# Patient Record
Sex: Female | Born: 1935 | Race: White | Hispanic: No | Smoking: Never smoker
Health system: Southern US, Community
[De-identification: ages and names within clinical notes are randomized; demographics above are authoritative.]

## PROBLEM LIST (undated history)

## (undated) DIAGNOSIS — N200 Calculus of kidney: Secondary | ICD-10-CM

## (undated) DIAGNOSIS — M8580 Other specified disorders of bone density and structure, unspecified site: Secondary | ICD-10-CM

## (undated) DIAGNOSIS — I739 Peripheral vascular disease, unspecified: Secondary | ICD-10-CM

## (undated) DIAGNOSIS — D6851 Activated protein C resistance: Secondary | ICD-10-CM

## (undated) DIAGNOSIS — I639 Cerebral infarction, unspecified: Secondary | ICD-10-CM

## (undated) DIAGNOSIS — M51369 Other intervertebral disc degeneration, lumbar region without mention of lumbar back pain or lower extremity pain: Secondary | ICD-10-CM

## (undated) DIAGNOSIS — M5136 Other intervertebral disc degeneration, lumbar region: Secondary | ICD-10-CM

## (undated) DIAGNOSIS — K648 Other hemorrhoids: Secondary | ICD-10-CM

## (undated) DIAGNOSIS — K219 Gastro-esophageal reflux disease without esophagitis: Secondary | ICD-10-CM

## (undated) DIAGNOSIS — E785 Hyperlipidemia, unspecified: Secondary | ICD-10-CM

## (undated) DIAGNOSIS — I10 Essential (primary) hypertension: Secondary | ICD-10-CM

## (undated) DIAGNOSIS — E042 Nontoxic multinodular goiter: Secondary | ICD-10-CM

## (undated) DIAGNOSIS — I2510 Atherosclerotic heart disease of native coronary artery without angina pectoris: Secondary | ICD-10-CM

## (undated) DIAGNOSIS — D689 Coagulation defect, unspecified: Secondary | ICD-10-CM

## (undated) DIAGNOSIS — Z5189 Encounter for other specified aftercare: Secondary | ICD-10-CM

## (undated) DIAGNOSIS — I8390 Asymptomatic varicose veins of unspecified lower extremity: Secondary | ICD-10-CM

## (undated) DIAGNOSIS — I809 Phlebitis and thrombophlebitis of unspecified site: Secondary | ICD-10-CM

## (undated) HISTORY — DX: Other hemorrhoids: K64.8

## (undated) HISTORY — DX: Activated protein C resistance: D68.51

## (undated) HISTORY — DX: Nontoxic multinodular goiter: E04.2

## (undated) HISTORY — DX: Peripheral vascular disease, unspecified: I73.9

## (undated) HISTORY — DX: Atherosclerotic heart disease of native coronary artery without angina pectoris: I25.10

## (undated) HISTORY — PX: TOTAL ABDOMINAL HYSTERECTOMY: SHX209

## (undated) HISTORY — DX: Other intervertebral disc degeneration, lumbar region: M51.36

## (undated) HISTORY — DX: Other specified disorders of bone density and structure, unspecified site: M85.80

## (undated) HISTORY — PX: CORONARY ARTERY BYPASS GRAFT: SHX141

## (undated) HISTORY — DX: Other intervertebral disc degeneration, lumbar region without mention of lumbar back pain or lower extremity pain: M51.369

## (undated) HISTORY — DX: Phlebitis and thrombophlebitis of unspecified site: I80.9

## (undated) HISTORY — DX: Coagulation defect, unspecified: D68.9

## (undated) HISTORY — DX: Gastro-esophageal reflux disease without esophagitis: K21.9

## (undated) HISTORY — DX: Hyperlipidemia, unspecified: E78.5

## (undated) HISTORY — PX: BLADDER REPAIR: SHX76

## (undated) HISTORY — DX: Asymptomatic varicose veins of unspecified lower extremity: I83.90

## (undated) HISTORY — DX: Encounter for other specified aftercare: Z51.89

## (undated) HISTORY — DX: Calculus of kidney: N20.0

## (undated) HISTORY — PX: BREAST BIOPSY: SHX20

## (undated) HISTORY — DX: Essential (primary) hypertension: I10

---

## 1999-03-02 ENCOUNTER — Other Ambulatory Visit: Admission: RE | Admit: 1999-03-02 | Discharge: 1999-03-02 | Payer: Self-pay | Admitting: Obstetrics and Gynecology

## 2000-03-01 ENCOUNTER — Other Ambulatory Visit: Admission: RE | Admit: 2000-03-01 | Discharge: 2000-03-01 | Payer: Self-pay | Admitting: Obstetrics and Gynecology

## 2001-04-10 ENCOUNTER — Other Ambulatory Visit: Admission: RE | Admit: 2001-04-10 | Discharge: 2001-04-10 | Payer: Self-pay | Admitting: Obstetrics and Gynecology

## 2002-07-22 ENCOUNTER — Ambulatory Visit (HOSPITAL_COMMUNITY): Admission: RE | Admit: 2002-07-22 | Discharge: 2002-07-22 | Payer: Self-pay | Admitting: Orthopedic Surgery

## 2002-08-14 ENCOUNTER — Encounter: Admission: RE | Admit: 2002-08-14 | Discharge: 2002-08-14 | Payer: Self-pay | Admitting: Internal Medicine

## 2002-08-14 ENCOUNTER — Encounter: Payer: Self-pay | Admitting: Internal Medicine

## 2002-08-15 ENCOUNTER — Encounter: Payer: Self-pay | Admitting: *Deleted

## 2002-08-15 ENCOUNTER — Encounter: Payer: Self-pay | Admitting: Emergency Medicine

## 2002-08-15 ENCOUNTER — Emergency Department (HOSPITAL_COMMUNITY): Admission: EM | Admit: 2002-08-15 | Discharge: 2002-08-15 | Payer: Self-pay | Admitting: Emergency Medicine

## 2002-11-13 ENCOUNTER — Encounter: Admission: RE | Admit: 2002-11-13 | Discharge: 2003-02-11 | Payer: Self-pay | Admitting: Internal Medicine

## 2002-12-10 HISTORY — PX: CORONARY ARTERY BYPASS GRAFT: SHX141

## 2003-07-06 ENCOUNTER — Inpatient Hospital Stay (HOSPITAL_COMMUNITY): Admission: RE | Admit: 2003-07-06 | Discharge: 2003-07-13 | Payer: Self-pay | Admitting: Cardiology

## 2003-07-07 ENCOUNTER — Encounter: Payer: Self-pay | Admitting: Surgery

## 2003-07-08 ENCOUNTER — Encounter: Payer: Self-pay | Admitting: Surgery

## 2003-07-09 ENCOUNTER — Encounter: Payer: Self-pay | Admitting: Surgery

## 2003-07-10 ENCOUNTER — Encounter: Payer: Self-pay | Admitting: Surgery

## 2003-08-09 ENCOUNTER — Encounter (HOSPITAL_COMMUNITY): Admission: RE | Admit: 2003-08-09 | Discharge: 2003-11-07 | Payer: Self-pay | Admitting: Cardiology

## 2004-07-18 ENCOUNTER — Emergency Department (HOSPITAL_COMMUNITY): Admission: EM | Admit: 2004-07-18 | Discharge: 2004-07-19 | Payer: Self-pay | Admitting: Emergency Medicine

## 2004-07-27 ENCOUNTER — Encounter: Admission: RE | Admit: 2004-07-27 | Discharge: 2004-07-27 | Payer: Self-pay | Admitting: Internal Medicine

## 2004-07-31 ENCOUNTER — Encounter: Admission: RE | Admit: 2004-07-31 | Discharge: 2004-08-22 | Payer: Self-pay | Admitting: Internal Medicine

## 2004-10-05 ENCOUNTER — Encounter: Admission: RE | Admit: 2004-10-05 | Discharge: 2004-10-05 | Payer: Self-pay | Admitting: Orthopedic Surgery

## 2006-03-11 ENCOUNTER — Encounter: Admission: RE | Admit: 2006-03-11 | Discharge: 2006-03-11 | Payer: Self-pay | Admitting: Orthopedic Surgery

## 2006-03-12 ENCOUNTER — Encounter (INDEPENDENT_AMBULATORY_CARE_PROVIDER_SITE_OTHER): Payer: Self-pay | Admitting: *Deleted

## 2006-03-12 ENCOUNTER — Ambulatory Visit (HOSPITAL_BASED_OUTPATIENT_CLINIC_OR_DEPARTMENT_OTHER): Admission: RE | Admit: 2006-03-12 | Discharge: 2006-03-12 | Payer: Self-pay | Admitting: Orthopedic Surgery

## 2006-10-19 ENCOUNTER — Emergency Department (HOSPITAL_COMMUNITY): Admission: EM | Admit: 2006-10-19 | Discharge: 2006-10-19 | Payer: Self-pay | Admitting: Family Medicine

## 2007-01-07 ENCOUNTER — Encounter: Admission: RE | Admit: 2007-01-07 | Discharge: 2007-01-07 | Payer: Self-pay | Admitting: Internal Medicine

## 2007-01-13 ENCOUNTER — Encounter: Admission: RE | Admit: 2007-01-13 | Discharge: 2007-01-13 | Payer: Self-pay | Admitting: Internal Medicine

## 2007-01-27 ENCOUNTER — Other Ambulatory Visit: Admission: RE | Admit: 2007-01-27 | Discharge: 2007-01-27 | Payer: Self-pay | Admitting: Interventional Radiology

## 2007-01-27 ENCOUNTER — Encounter (INDEPENDENT_AMBULATORY_CARE_PROVIDER_SITE_OTHER): Payer: Self-pay | Admitting: *Deleted

## 2007-01-27 ENCOUNTER — Encounter: Admission: RE | Admit: 2007-01-27 | Discharge: 2007-01-27 | Payer: Self-pay | Admitting: Internal Medicine

## 2007-02-20 ENCOUNTER — Encounter: Admission: RE | Admit: 2007-02-20 | Discharge: 2007-02-20 | Payer: Self-pay | Admitting: Internal Medicine

## 2008-03-03 ENCOUNTER — Encounter: Admission: RE | Admit: 2008-03-03 | Discharge: 2008-03-03 | Payer: Self-pay | Admitting: Orthopedic Surgery

## 2008-03-30 ENCOUNTER — Encounter: Admission: RE | Admit: 2008-03-30 | Discharge: 2008-03-30 | Payer: Self-pay | Admitting: Orthopedic Surgery

## 2009-06-30 ENCOUNTER — Emergency Department (HOSPITAL_COMMUNITY): Admission: EM | Admit: 2009-06-30 | Discharge: 2009-06-30 | Payer: Self-pay | Admitting: Emergency Medicine

## 2009-07-28 ENCOUNTER — Encounter: Admission: RE | Admit: 2009-07-28 | Discharge: 2009-07-28 | Payer: Self-pay | Admitting: Internal Medicine

## 2010-01-31 ENCOUNTER — Encounter: Admission: RE | Admit: 2010-01-31 | Discharge: 2010-01-31 | Payer: Self-pay | Admitting: Internal Medicine

## 2010-02-20 ENCOUNTER — Encounter: Admission: RE | Admit: 2010-02-20 | Discharge: 2010-03-28 | Payer: Self-pay | Admitting: Internal Medicine

## 2010-11-21 ENCOUNTER — Ambulatory Visit: Payer: Self-pay | Admitting: Cardiology

## 2011-02-07 ENCOUNTER — Encounter (HOSPITAL_BASED_OUTPATIENT_CLINIC_OR_DEPARTMENT_OTHER)
Admission: RE | Admit: 2011-02-07 | Discharge: 2011-02-07 | Disposition: A | Discharge status: Home / Self Care | Payer: Medicare Other | Source: Ambulatory Visit | Attending: Orthopedic Surgery | Admitting: Orthopedic Surgery

## 2011-02-07 DIAGNOSIS — Z01812 Encounter for preprocedural laboratory examination: Secondary | ICD-10-CM | POA: Insufficient documentation

## 2011-02-07 DIAGNOSIS — Z01810 Encounter for preprocedural cardiovascular examination: Secondary | ICD-10-CM | POA: Insufficient documentation

## 2011-02-07 LAB — BASIC METABOLIC PANEL
BUN: 23 mg/dL (ref 6–23)
CO2: 28 mEq/L (ref 19–32)
Calcium: 10.1 mg/dL (ref 8.4–10.5)
Chloride: 107 mEq/L (ref 96–112)
Creatinine, Ser: 1.22 mg/dL — ABNORMAL HIGH (ref 0.4–1.2)
GFR calc Af Amer: 51 mL/min — ABNORMAL LOW (ref >60)
GFR calc non Af Amer: 42 mL/min — ABNORMAL LOW (ref >60)
Glucose, Bld: 133 mg/dL — ABNORMAL HIGH (ref 70–99)
Potassium: 4.2 mEq/L (ref 3.5–5.1)
Sodium: 141 mEq/L (ref 135–145)

## 2011-02-07 LAB — PROTIME-INR
INR: 1.05 (ref 0.00–1.49)
Prothrombin Time: 13.9 seconds (ref 11.6–15.2)

## 2011-02-07 LAB — APTT: aPTT: 26 seconds (ref 24–37)

## 2011-02-08 ENCOUNTER — Ambulatory Visit (HOSPITAL_COMMUNITY): Payer: Medicare Other

## 2011-02-08 ENCOUNTER — Ambulatory Visit (HOSPITAL_BASED_OUTPATIENT_CLINIC_OR_DEPARTMENT_OTHER)
Admission: RE | Admit: 2011-02-08 | Discharge: 2011-02-08 | Disposition: A | Discharge status: Home / Self Care | Payer: Medicare Other | Source: Ambulatory Visit | Attending: Orthopedic Surgery | Admitting: Orthopedic Surgery

## 2011-02-08 DIAGNOSIS — Z01812 Encounter for preprocedural laboratory examination: Secondary | ICD-10-CM | POA: Insufficient documentation

## 2011-02-08 DIAGNOSIS — G5600 Carpal tunnel syndrome, unspecified upper limb: Secondary | ICD-10-CM | POA: Insufficient documentation

## 2011-02-08 DIAGNOSIS — Z01810 Encounter for preprocedural cardiovascular examination: Secondary | ICD-10-CM | POA: Insufficient documentation

## 2011-02-08 DIAGNOSIS — Z01818 Encounter for other preprocedural examination: Secondary | ICD-10-CM | POA: Insufficient documentation

## 2011-02-08 LAB — POCT HEMOGLOBIN-HEMACUE: Hemoglobin: 12.9 g/dL (ref 12.0–15.0)

## 2011-02-20 NOTE — Op Note (Signed)
NAMEARYKA, COONRADT                 ACCOUNT NO.:  [id number]  MEDICAL RECORD NO.:  [id number]         PATIENT TYPE:  AMB  LOCATION:                               FACILITY:  [facility]  PHYSICIAN:  Katy Fitch. Nakeya Adinolfi, M.D. DATE OF BIRTH:  1923/09/11  DATE OF PROCEDURE:  02/08/2011 DATE OF DISCHARGE:                              OPERATIVE REPORT   PREOPERATIVE DIAGNOSIS:  Entrapment neuropathy median nerve, right carpal tunnel.  POSTOPERATIVE DIAGNOSIS:  Entrapment neuropathy median nerve, right carpal tunnel.  OPERATIONS:  Release of right transverse carpal ligament.  OPERATING SURGEON:  Katy Fitch. Adilynn Bessey, MD  ASSISTANT:  Marveen Reeks Dasnoit, PA  ANESTHESIA:  General by LMA.  SUPERVISING ANESTHESIOLOGIST:  Janetta Hora. Gelene Mink, MD  INDICATIONS:  Heather Alvarado is a very pleasant spry 77 year old woman referred through the courtesy of Dr. Creola Corn of [facility] for evaluation and management of bilateral hand numbness. She has had prior trigger fingers in the past.  Clinical examination at our office revealed signs of clinical carpal tunnel syndrome. Electrodiagnostic studies were completed by Dr. Johna Roles revealed significant bilateral carpal tunnel syndrome, right greater than left.  Ms. Meara is on chronic Coumadin anticoagulation.  We spoke with her primary care physician and advised her to hold her Coumadin for 5-7 days prior to surgery.  Her preoperative INR was 1.05.  She is brought to the operating room at this time anticipating release of right transverse carpal ligament.  PROCEDURE:  Jessic S. Mcgaugh was brought to room one of the [facility] and placed in supine position on the operating table.  Following the induction of general anesthesia by LMA technique, the right arm was prepped with Betadine soap and solution and sterilely draped.  Following routine surgical time-out, the procedure commenced with exsanguination of right arm with Esmarch  bandage, inflation of arterial tourniquet and proximal brachium at 220 mmHg.  A short incision was fashioned in line of the ring finger and the palm.  Subcutaneous tissues were carefully divided revealing the palmar fascia.  This was split longitudinally to reveal the common sensory branch of the median nerve.  These were followed back to the transverse carpal ligament which was gently isolated from the median nerve.  The ligament was then released along its ulnar border extending into the distal forearm.  This widely opened the carpal canal.  The contents of the canal revealed a fibrotic bursa.  No mass or other predicaments were noted.  The wound was then inspected for bleeding points which were not problematic.  The wound was repaired with intradermal 3-0 Prolene and compressive splint.  For aftercare, [name] is provided prescriptions for Vicodin 5 mg one p.o. q.4-6 h. p.r.n. pain.  She will resume her routine Coumadin dose this afternoon.  I will see her back for follow up in a week for dressing change.  She will contact us immediately should she have difficulties with bleeding, excessive bruising, or possible wound complications.     Katy Fitch Saraann Enneking, M.D.     RVS/MEDQ  D:  02/08/2011  T:  02/08/2011  Job:  [id number]  cc:  Gwen Pounds, MD  Electronically Signed by Josephine Igo M.D. on 02/20/2011 01:09:28 PM

## 2011-03-18 LAB — CBC
HCT: 41.2 % (ref 36.0–46.0)
Hemoglobin: 13.9 g/dL (ref 12.0–15.0)
MCHC: 33.7 g/dL (ref 30.0–36.0)
MCV: 89.3 fL (ref 78.0–100.0)
Platelets: 230 10*3/uL (ref 150–400)
RBC: 4.61 MIL/uL (ref 3.87–5.11)
RDW: 13.6 % (ref 11.5–15.5)
WBC: 6.8 10*3/uL (ref 4.0–10.5)

## 2011-03-18 LAB — DIFFERENTIAL
Basophils Absolute: 0 10*3/uL (ref 0.0–0.1)
Basophils Relative: 1 % (ref 0–1)
Eosinophils Absolute: 0.1 10*3/uL (ref 0.0–0.7)
Eosinophils Relative: 1 % (ref 0–5)
Lymphocytes Relative: 30 % (ref 12–46)
Lymphs Abs: 2 10*3/uL (ref 0.7–4.0)
Monocytes Absolute: 0.6 10*3/uL (ref 0.1–1.0)
Monocytes Relative: 9 % (ref 3–12)
Neutro Abs: 4 10*3/uL (ref 1.7–7.7)
Neutrophils Relative %: 59 % (ref 43–77)

## 2011-03-18 LAB — PROTIME-INR: Prothrombin Time: 24.7 seconds — ABNORMAL HIGH (ref 11.6–15.2)

## 2011-04-27 NOTE — Cardiovascular Report (Signed)
NAMECAELEN, HIGINBOTHAM                       ACCOUNT NO.:  [id number]   MEDICAL RECORD NO.:  [id number]                   PATIENT TYPE:  INP   LOCATION:  2038                                 FACILITY:  [facility]   PHYSICIAN:  Evelene Croon, M.D.                  DATE OF BIRTH:  May 23, 1923   DATE OF PROCEDURE:  DATE OF DISCHARGE:  07/13/2003                              CARDIAC CATHETERIZATION   HISTORY OF PRESENT ILLNESS:  This patient is a 77 year old white female who  presented with a 2-week history of progressive chest pain in the upper  parasternal region, associated with shortness of breath and marked fatigue.  Her symptoms have been relieved with rest. The patient has a previous  history of recurrent DVT, requiring Coumadin therapy and therefore, a chest  CT scan was done to rule out  pulmonary embolism and this was negative. It  did suggest a substernal goiter. She underwent a Cardiolite scan, which  showed inferolateral ischemia with an ejection fraction of 52%. She was  admitted this hospitalization for further evaluation and treatment including  cardiac catheterization.   PAST MEDICAL HISTORY:  1. Factor V Leiden deficiency with recurrent DVT, requiring chronic Coumadin     therapy.  2. History of hyperlipidemia.  3. History of hypertension.   PAST SURGICAL HISTORY:  1. Status post right leg varicose vein ligation.  2. She has undergone surgery for a trigger finger.  3. Status post hysterectomy.  4. Status post bladder repair.  5. Status post right breast biopsy.   ADMISSION MEDICATIONS:  1. Hydrochlorothiazide 25 mg daily.  2. Coumadin 5 mg daily.  3. Aciphex 20 mg daily.  4. Benazepril 10 mg daily.  5. Os-Cal 1,000 mg daily.  6. Multivitamin 1 daily.   FAMILY HISTORY/SOCIAL HISTORY/REVIEW OF SYSTEMS/PHYSICAL EXAMINATION:  Please see the history and physical done at the time of admission.   ALLERGIES:  She does have an allergy to CODEINE, IVP DYE.   HOSPITAL  COURSE:  The patient was admitted. She was pre-medicated for  cardiac catheterization. She was found to have multi-vessel coronary artery  disease including the following lesions:  LAD had a 50-60% stenosis after  the first diagonal branch. There was a 90% mid vessel stenosis. The diagonal  branch itself had about 90% disease. There was an intermediate vessel that  had a 50% osteal stenosis. The left circumflex had about 60% stenosis, after  the small first marginal branch. The right coronary artery had a 95%  stenosis at the crux and was a large dominant system. Ejection fraction was  80%. Due to these findings, surgical consultation was obtained with Evelene Croon, M.D. and he evaluated the patient and recommended surgical re-  vascularization. Preoperative vein mapping showed poor veins and  subsequently, it was felt that she would require all arterial bypass.   PROCEDURE:  On July 08, 2003 the patient was taken to  the cardiac operating  room where she underwent the following procedure:  Coronary artery bypass  grafting times 4. The following grafts were placed.  1.  Left internal  mammary artery to the LAD.  2.  Right internal mammary artery to the right  coronary artery.  3.  Left radial artery sequential graft to the obtuse  marginal 1 and obtuse marginal 2. Cross clamp time was 87 minutes. The  patient tolerated the procedure well. It was noted intraoperatively that the  diagonal vessel was not felt to be a graftable artery.   POSTOPERATIVE HOSPITAL COURSE:  The patient did well postoperatively. She  had no significant difficulties in her intensive care stay. She was anemic  to a hematocrit of 24.1, requiring 1 unit of packed red blood cell  transfusion. The patient did improve to a level of 28 on postoperative day  2.  The patient was restarted back on her Coumadin. All routine lines,  monitoring devices, and drainage devices were discontinued in a step-wise  manner without  difficulty. She tolerated a rapid advancement in regard to  cardiac rehabilitation phase I modalities. Her incisions are healing well  with her left arm neurovascularly intact, although there is evidence of  significant ecchymosis, which is stable and improving daily. Chest x-ray  reveals a small right effusion and she is felt to require further diuresis  at home. Rhythm has been maintained in normal sinus. Coumadin has been  adjusted daily due to her factor V deficiency and previous history of DVT.  Overall, it is felt that she is tentatively stable for discharge on the  morning of July 13, 2003, pending morning round re-evaluation.   DISCHARGE MEDICATIONS:  1. Lasix 40 mg daily for an additional 14 days.  2. K-Dur 20 mEq daily for an additional 14 days.  3. Coumadin 5 mg daily and as directed by Dr. Timothy Lasso. First INR should be     drawn at his office on July 15, 2003 and adjusted accordingly.  4. Toprol XL 25 mg daily.  5. Imdur 30 mg daily for 1 month for the radial artery graft.  6. Zocor 40 mg daily.   INSTRUCTIONS:  The patient received written instructions in regard to  medications, activity, diet, wound care and followup.   FOLLOW UP:  Will include Dr. Swaziland in 2 weeks. Dr. Laneta Simmers August 03, 2003  at 11:45 a.m.   FINAL DIAGNOSES:  1. Severe multivessel coronary artery disease as described above. Normal     ejection fraction, 80% on cardiac catheterization.  2. Factor V Leiden deficiency.  3. History of hyperlipidemia.  4. History of hypertension.  5. Postoperative anemia, improved.     Rowe Clack, P.A.-C.                    Evelene Croon, M.D.    Sherryll Burger  D:  07/12/2003  T:  07/13/2003  Job:  [id number]   cc:   Evelene Croon, M.D.  [address]  [place]  [place] [address]  Fax: [phone]   Peter M. Swaziland, M.D.  [address]  [place], [place] [address]  Fax: [phone]   Gwen Pounds, M.D.  [address] [place]  [place] [address]  Fax:  [phone]

## 2011-04-27 NOTE — H&P (Signed)
NAME:  Heather Alvarado, Heather Alvarado NO.:  [id number]   MEDICAL RECORD NO.:  [id number]                   PATIENT TYPE:  OIB   LOCATION:                                       FACILITY:  [facility]   PHYSICIAN:  Peter M. Swaziland, M.D.               DATE OF BIRTH:  1923/08/04   DATE OF ADMISSION:  07/06/2003  DATE OF DISCHARGE:                                HISTORY & PHYSICAL   HISTORY OF PRESENT ILLNESS:  Heather Alvarado is a 77 year old white female who  presents with a two-week history of typical anginal symptoms.  She describes  it as upper parasternal pain associated with shortness of breath on exertion  and marked fatigue.  It is relieved with rest.  She had undergone a previous  chest CT scan with contrast which was negative for pulmonary embolus or  aortic dissection.  She had a stress Cardiolite study which was subtle, but  suggestive for inferolateral ischemia.  The ejection fraction was 83% with  small ventricular size.  The patient is now admitted for cardiac  catheterization.   PAST MEDICAL HISTORY:  1. Significant for factor V Leiden deficiency with recurrent deep venous     thrombosis.  She has been on chronic Coumadin therapy.  2. She has a history of IVP DYE ALLERGY associated with recent rash with     contrast for CT scan.  3. She has a history of hyperlipidemia which is combined.  4. History of hypertension.   PAST SURGICAL HISTORY:  1. She has had previous vein surgery.  2. Previous surgery for trigger finger.  3. Previous hysterectomy.  4. She is status post bladder repair.  5. She has had previous right breast biopsy.   ALLERGIES:  1. CODEINE.  2. IVP DYE.   CURRENT MEDICATIONS:  1. Potassium 20 mEq daily.  2. HCTZ 25 mg per day.  3. Aciphex 20 mg per day.  4. ________ 10 mg per day.  5. Os-Cal 500 mg two tablets daily.  6. Multivitamins daily.  7. Coumadin 5 mg per day which is currently on hold.   FAMILY HISTORY:  Her father died at age  64 of old age.  Her mother died at  age 17 with myocardial infarction.  One brother died with myocardial  infarction.   SOCIAL HISTORY:  The patient is retired.  She is married.  She has two  children.  She denies tobacco or alcohol use.   REVIEW OF SYSTEMS:  Otherwise negative.   PHYSICAL EXAMINATION:  GENERAL APPEARANCE:  The patient is a pleasant white  female in no apparent distress.  VITAL SIGNS:  The blood pressure is 140/72, pulse 68 and regular, and  respirations are 18.  WEIGHT:  141 pounds.  HEENT:  Pupils equal, round, and reactive to light and accomodation.  Extraocular movements are full.  Sclerae are clear.  The oropharynx is  clear.  NECK:  Supple without JVD, adenopathy, thyromegaly, or bruits.  LUNGS:  Clear to auscultation and percussion.  CARDIAC:  Regular rate and rhythm without murmurs, rubs, gallops, or clicks.  ABDOMEN:  Soft and nontender without hepatosplenomegaly, masses, or bruits.  EXTREMITIES:  Femoral and pedal pulses are 2+ and symmetric.  There is no  evidence of phlebitis or edema.  NEUROLOGIC:  Nonfocal.   LABORATORY DATA:  The resting ECG showed minor nonspecific ST changes.  The  CMET was normal.  The CBC showed a white count of 12,100, platelets 330, and  hemoglobin 13.7.  The pro time on July 01, 2003, was 17.9 with an INR of  2.47.  A recent CT scan showed an intrathoracic goiter and otherwise normal.  The most recent lipid panel showed a cholesterol of 248, triglycerides 238,  LDL 146, and HDL 54.   IMPRESSION:  1. Angina pectoris with subtle inferolateral defect on Cardiolite study.  2. History of recurrent deep venous thrombosis.  3. Factor V Leiden deficiency.  4. Chronic Coumadin therapy.  5. Intravenous pyelogram dye allergy.  6. Hypertension.  7. Combined hyperlipidemia.   PLAN:  The patient will be admitted for cardiac catheterization.  Her  Coumadin is on hold and she will be bridged with subcu heparin.  She will be   pretreated for a dye allergy with prednisone and cimetidine.                                                Peter M. Swaziland, M.D.    PMJ/MEDQ  D:  07/05/2003  T:  07/05/2003  Job:  [id number]   cc:   Gwen Pounds, M.D.  [address]  [place]  [place] [address]  Fax: [phone]

## 2011-04-27 NOTE — Consult Note (Signed)
Heather Alvarado, Heather Alvarado                       ACCOUNT NO.:  [id number]   MEDICAL RECORD NO.:  [id number]                   PATIENT TYPE:  OIB   LOCATION:  2905                                 FACILITY:  [facility]   PHYSICIAN:  Evelene Croon, M.D.                  DATE OF BIRTH:  [birth year]   DATE OF CONSULTATION:  07/06/2003  DATE OF DISCHARGE:                                   CONSULTATION   REASON FOR CONSULTATION:  Severe three-vessel coronary artery disease with  unstable angina.   HISTORY OF PRESENT ILLNESS:  This patient is a 77 year old white female who  presented with a two-week history of progressive chest pain in the upper  parasternal region associated with shortness of breath and marked fatigue.  Her symptoms have been relieved with rest.  She has a history of previous  recurrent DVT requiring Coumadin therapy, and therefore a CT of the chest  was done to rule out pulmonary embolism, and this was negative; it did  suggest a substernal goiter.  She underwent a Cardiolite scan, which showed  inferolateral ischemia with an ejection fraction of 52%.  She underwent  cardiac catheterization today, which showed severe three-vessel disease.  The LAD had 50 to 60% stenosis at the first diagonal branch.  There was 90%  mid vessel stenosis.  The diagonal branch itself had about 90% disease.  There was an intermediate vessel that had 50% ostial stenosis.  The left  circumflex had about 60% stenosis after a small first marginal branch.  The  right coronary artery had 95% stenosis at the crux and was a large, dominant  system.  Ejection fraction was 80%.   PAST MEDICAL HISTORY:  Significant for:  1. Factor V Leiden deficiency with recurrent DVT requiring chronic Coumadin     therapy.  2. She has a history of hyperlipidemia.  3. She has a history of hypertension.   PAST SURGICAL HISTORY:  1. She is status post a right leg varicose vein ligation.  2. She has undergone surgery for a  trigger finger.  3. She is status post a hysterectomy.  4. She is status post bladder repair.  5. She is status post a right breast biopsy.   MEDICATIONS PRIOR TO ADMISSION:  1. HCTZ 25 mg daily.  2. Coumadin 5 mg daily.  3. Aciphex 20 mg daily.  4. Benazepril 10 mg daily.  5. Os-Cal 1000 mg daily.  6. Multivitamin daily.   REVIEW OF SYSTEMS:  GENERAL:  She denies fever or chills.  She has had no  weight loss.  She has had fatigue.  EYES:  Negative.  ENT:  Negative.  ENDOCRINE:  She denies diabetes and hypothyroidism.  CARDIOVASCULAR:  She  has had about two weeks of progressive parasternal left-sided chest pain.  She has shortness of breath.  She has had no PND or orthopnea.  She denies  peripheral edema and  palpitations.  RESPIRATORY:  She has had no cough or  sputum production.  GI:  She has had no nausea or vomiting.  She denies  melena and bright red blood per rectum.  GU:  She has had no dysuria or  hematuria.  VASCULAR:  She has had recurrent right leg DVT.  She has no  claudication.  PSYCHIATRIC:  Negative.  MUSCULOSKELETAL:  Negative.  NEUROLOGICAL:  She has never had a TIA or a stroke.  She denies any focal  weakness or numbness.  She has had no dizziness or syncope.   ALLERGIES:  1. CODEINE.  2. IVP DYE.   FAMILY HISTORY:  Her father died at age 19 of old age, and her mother died  at 67 with a myocardial infarction.  She has a brother who had a myocardial  infarction and is dead.   SOCIAL HISTORY:  She is retired and has two children.  She is a nonsmoker  and denies ethanol abuse.   PHYSICAL EXAMINATION:  VITAL SIGNS:  Her blood pressure is 110/45, and her  pulse is 70 and regular.  Oxygen saturation is 94% on room air.  She is  afebrile.  She is currently on a nitroglycerin drip at 3 mL/hour and a  heparin drip.  GENERAL:  She is a well-developed white female in no distress.  HEENT EXAM:  Shows her to be normocephalic and atraumatic.  The pupils are  equal and  reactive to light and accommodation.  Extraocular muscles are  intact.  Her throat is clear.  NECK EXAM:  Shows normal carotid pulses bilaterally.  There are no bruits.  There is no adenopathy or thyromegaly.  CARDIAC EXAM:  Has a regular rate and rhythm with a normal S1 and S2.  There  is no murmur, rub, or gallop.  LUNGS:  Clear.  ABDOMINAL EXAM:  Shows active bowel sounds.  Her abdomen is soft, mildly  obese, and nontender.  There are no palpable masses or organomegaly.  EXTREMITY EXAM:  Shows no peripheral edema.  Pedal pulses are palpable  bilaterally.  Her right lower leg has some brownish discoloration from  varicose vein ligations.  NEUROLOGIC EXAM:  Shows her to be alert and oriented times three.  Motor and  sensory exam is grossly normal.   LABORATORY DATA:  Carotid Doppler examination shows bilateral mild internal  carotid artery plaque without stenosis.  Her ABI is greater than 1  bilaterally.   IMPRESSION AND PLAN:  In summary, [name] has severe three-vessel coronary  artery disease, unstable anginal symptoms.  She had chest pain during her  catheterization, requiring intravenous nitroglycerin.  She is currently pain-  free.  I agree that coronary artery bypass graft surgery is the best  treatment for this patient.  I have discussed the operative procedure with  her and her son and daughter, including alternatives, benefits, and risks,  including bleeding, blood transfusion, infection, stroke, myocardial  infarction, and death; they understand and agree to proceed with surgery.  We will plan to do this on Thursday, July 08, 2003.                                               Evelene Croon, M.D.    BB/MEDQ  D:  07/06/2003  T:  07/07/2003  Job:  [id number]

## 2011-04-27 NOTE — Op Note (Signed)
NAMEEVELINA, LORE                ACCOUNT NO.:  [id number]   MEDICAL RECORD NO.:  [id number]          PATIENT TYPE:  AMB   LOCATION:  DSC                          FACILITY:  [facility]   PHYSICIAN:  Katy Fitch. Sypher, M.D. DATE OF BIRTH:  [birth year]   DATE OF PROCEDURE:  03/12/2006  DATE OF DISCHARGE:                                 OPERATIVE REPORT   PREOPERATIVE DIAGNOSIS:  Chronic stenosing tenosynovitis right long finger  and Dupuytren's contracture affecting pretendinous fibers to right long and  ring finger.   POSTOPERATIVE DIAGNOSIS:  Chronic stenosing tenosynovitis right long finger  and Dupuytren's contracture affecting pretendinous fibers to right long and  ring finger.   OPERATION:  1.  Resection of pretendinous fibers and palmar fibromatosis to long and      ring finger rays right palm.  2.  Release of right long finger A1 pulley with synovectomy of flexor      tendons.   OPERATIONS:  Katy Fitch. Sypher, M.D.   ASSISTANT:  None.   ANESTHESIA:  2% lidocaine metacarpal head level block of right long and ring  finger supplemented by IV sedation.   SUPERVISING ANESTHESIOLOGIST:  Guadalupe Maple, M.D.   INDICATIONS:  Inetha Ahmiyah Coil is an 78 year old woman referred for  evaluation and management of Dupuytren's contracture and a locking right  long finger.  She had prior treatment of a locking right ring finger with  success.  She was troubled by persistent Dupuytren's contracture and  triggering of the long finger unresponsive to nonoperative measures.  She is  brought to the operating room this time for release of right long finger A1  pulley and excision of her pathologic palmar fibromatosis leading to  contracture of her MP joints.  Preoperatively, she was noted to be and  Coumadin due to past history of coronary artery disease and a clotting  factor abnormality that had led to deep vein thrombosis risk.  She was  weaned for her Coumadin for five days and had a  preoperative PTT and  prothrombin time that demonstrated correction of her clotting capacity.  After informed consent and after review of her medical records in Dr.  Elvis Coil office as well as preoperative EKG and chest x-ray, she is brought  to the operating at this time anticipating right long finger A1 pulley  release and Dupuytren's contracture excision.   PROCEDURE:  Daysha Aston Lawhorn is brought to the operating room and placed in  supine position on the table.  Following light sedation, the right arm was  prepped with Betadine soap solution, sterilely draped.  A pneumatic  tourniquet was applied to the proximal right brachium.  Following  exsanguination of the right arm with an Esmarch bandage, the arterial  tourniquet was inflated to 240 mmHg due to mild systolic hypertension.  2%  lidocaine was infiltrated in the region of the common sensory branches to  the index, long and ring fingers.  After 5 minutes when anesthesia was  complete, the procedure commenced with an oblique incision paralleling the  distal palmar crease.  The fascia was exposed over  a distance of 3.5 cm  overlying the long and ring finger rays.  There was significant contracture  of the pretendinous fibers in the palmar fascia.  This was circumferentially  dissected and removed with the septum between the index and long, long and  ring, and ring and small fingers.  After removal of fascia, the flexor  sheaths were carefully inspected.  There was considerable fibrosis and  inflammation of the long finger A1 pulley.  The inflammatory tissues were  cleared with scalpel and scissors dissection followed by release of the  pulley along its radial border with scissors.  The tendons were delivered  and a cuff of inflammatory tenosynovium removed.  Likewise, the cuff of  inflammatory tenosynovium proximal to the A1 pulley of the ring finger was  removed.  Thereafter, bleeding points were cauterized with bipolar current   followed by repair the skin with mattress sutures of 5-0 nylon.  A  compressive dressing applied with Xeroflow, sterile gauze, a sterile Kerlix,  and an Ace wrap compression wrap.  Ms. Hilley tolerated the surgery and  anesthesia well.   For aftercare, she is provided a prescription for Darvocet N100 1 p.o. q.4-6  h. p.r.n. pain, 30 tablets without refill.  She is also encouraged to use  Tylenol if she is not taking Darvocet.  She will not use Advil or aspirin  given her Coumadin anticoagulation.      Katy Fitch Sypher, M.D.  Electronically Signed     RVS/MEDQ  D:  03/12/2006  T:  03/12/2006  Job:  [id number]

## 2011-06-27 ENCOUNTER — Encounter (HOSPITAL_BASED_OUTPATIENT_CLINIC_OR_DEPARTMENT_OTHER)
Admission: RE | Admit: 2011-06-27 | Discharge: 2011-06-27 | Disposition: A | Discharge status: Home / Self Care | Payer: Medicare Other | Source: Ambulatory Visit | Attending: Orthopedic Surgery | Admitting: Orthopedic Surgery

## 2011-06-27 LAB — BASIC METABOLIC PANEL
BUN: 25 mg/dL — ABNORMAL HIGH (ref 6–23)
CO2: 25 mEq/L (ref 19–32)
Chloride: 105 mEq/L (ref 96–112)
Creatinine, Ser: 1.1 mg/dL (ref 0.50–1.10)
GFR calc Af Amer: 57 mL/min — ABNORMAL LOW (ref >60)
Glucose, Bld: 126 mg/dL — ABNORMAL HIGH (ref 70–99)
Potassium: 4.4 mEq/L (ref 3.5–5.1)

## 2011-06-28 ENCOUNTER — Ambulatory Visit (HOSPITAL_BASED_OUTPATIENT_CLINIC_OR_DEPARTMENT_OTHER)
Admission: RE | Admit: 2011-06-28 | Discharge: 2011-06-28 | Disposition: A | Discharge status: Home / Self Care | Payer: Medicare Other | Source: Ambulatory Visit | Attending: Orthopedic Surgery | Admitting: Orthopedic Surgery

## 2011-06-28 DIAGNOSIS — D6859 Other primary thrombophilia: Secondary | ICD-10-CM | POA: Insufficient documentation

## 2011-06-28 DIAGNOSIS — Z951 Presence of aortocoronary bypass graft: Secondary | ICD-10-CM | POA: Insufficient documentation

## 2011-06-28 DIAGNOSIS — I10 Essential (primary) hypertension: Secondary | ICD-10-CM | POA: Insufficient documentation

## 2011-06-28 DIAGNOSIS — Z01818 Encounter for other preprocedural examination: Secondary | ICD-10-CM | POA: Insufficient documentation

## 2011-06-28 DIAGNOSIS — K219 Gastro-esophageal reflux disease without esophagitis: Secondary | ICD-10-CM | POA: Insufficient documentation

## 2011-06-28 DIAGNOSIS — I2510 Atherosclerotic heart disease of native coronary artery without angina pectoris: Secondary | ICD-10-CM | POA: Insufficient documentation

## 2011-06-28 DIAGNOSIS — Z7901 Long term (current) use of anticoagulants: Secondary | ICD-10-CM | POA: Insufficient documentation

## 2011-06-28 DIAGNOSIS — G5600 Carpal tunnel syndrome, unspecified upper limb: Secondary | ICD-10-CM | POA: Insufficient documentation

## 2011-06-28 DIAGNOSIS — Z01812 Encounter for preprocedural laboratory examination: Secondary | ICD-10-CM | POA: Insufficient documentation

## 2011-06-29 NOTE — Op Note (Signed)
Heather Alvarado, Heather Alvarado                ACCOUNT NO.:  [id number]  MEDICAL RECORD NO.:  [id number]  LOCATION:                                 FACILITY:  PHYSICIAN:  Katy Fitch. Richey Doolittle, M.D.      DATE OF BIRTH:  DATE OF PROCEDURE:  06/28/2011 DATE OF DISCHARGE:                              OPERATIVE REPORT   PREOPERATIVE DIAGNOSIS:  Chronic left carpal tunnel syndrome.  POSTOPERATIVE DIAGNOSIS:  Chronic left carpal tunnel syndrome.  OPERATION:  Release of left transverse carpal  ligament.  OPERATING SURGEON:  Katy Fitch. Decarla Siemen, MD.  ASSISTANT:  Marveen Reeks Dasnoit, PAC.  ANESTHESIA:  General by LMA.  SUPERVISING ANESTHESIOLOGIST:  Janetta Hora. Gelene Mink, MD.  INDICATIONS:  Elaynah Virginia is an 77 year old woman referred through the courtesy of Dr. Creola Corn for evaluation and management of hand numbness.  Ms. Lopes has a complex past medical history with a hematologic abnormality and factor V deficiency.  She has had a history of chronic Coumadin anticoagulation.  She has multiple drug allergies. She is status post right carpal tunnel release without complication, now presents for left carpal tunnel release.  Preoperatively, she had electrodiagnostic studies that document significant left carpal tunnel syndrome.  She has held her Coumadin for 5 days and resumed Coumadin anticoagulation this afternoon.  She was interviewed in the holding area, questions were invited and answered in detail.  PROCEDURE IN DETAIL:  Salimah Martinovich was brought to room 1 of the [facility] and placed in supine position on the operating table. Under Dr. Thornton Dales direct supervision, general anesthesia by LMA technique was induced.  The left arm was prepped with Betadine soap and solution and sterilely draped.  A pneumatic tourniquet was applied at the proximal left brachium.  Following routine surgical time-out, left arm was exsanguinated with Esmarch bandage and arterial tourniquet inflated to  250 mmHg due to mild systolic hypertension.  Procedure commenced with a short incision in line of the ring finger in the palm. Subcutaneous tissues were carefully divided revealing the palmar fascia. This was split longitudinally to reveal the common sensory branch of the median nerve and the superficial palmar arch.  The carpal canal was sounded with a Penfield 4 elevator followed by sequential release of the transverse carpal ligament from distal to proximal with scissors along its ulnar border.  This widely opened the carpal canal.  No mass or other predicaments were noted.  Bleeding points were identified and meticulously electrocauterized with bipolar forceps.  The canal was inspected with the aid of a Sewall retractor.  No mass or predicaments were noted.  Bleeding points along the margin of the released ligament were not problematic.  Ms. Amero's wound was then repaired with intradermal 3-0 Prolene suture. A compressive dressing was applied with volar plaster splint maintaining the wrist in 10 degrees of dorsiflexion.  For aftercare, Ms. Suell is provided a prescription for Vicodin 5 mg 1 p.o. q.4-6 hours p.r.n. pain, 20 tablets without a refill.  We will see her back in followup in 7-8 days for dressing change, suture removal, and advancement to a postoperative excise program.  She is advised to resume her Coumadin anticoagulation today  with a standard dosing.  She will check with Dr. Timothy Lasso next week to have her INR evaluated to be certain her coagulation status has returned to a proper dosing schedule with Coumadin.     Katy Fitch Marquesa Rath, M.D.     RVS/MEDQ  D:  06/28/2011  T:  06/28/2011  Job:  [id number]  Electronically Signed by Josephine Igo M.D. on 06/29/2011 08:39:02 AM

## 2011-10-19 ENCOUNTER — Telehealth: Payer: Self-pay | Admitting: *Deleted

## 2011-10-19 NOTE — Telephone Encounter (Signed)
Received a telephone call from Dr. Timothy Lasso. He is seeing patient today. She is reporting rectal bleeding x 4 days. Dr. Timothy Lasso examined her and her stomach is soft, no blood on his glove with rectal exam and she is heme negative. She is on Coumadin which she is going to hold 5-7 days. She has seen Dr. Marina Goodell in the past. Last colonoscopy 01/12/2003- diverticulosis, colon polyps, internal hemorrhoids. He wants her seen next week by Dr. Princella Pellegrini is only here on 10/25/11 and has no appointments) or an extender(no appts for them either) As Doc of the Day, Please, advise how soon patient should be seen.

## 2011-10-19 NOTE — Telephone Encounter (Signed)
Add to fist available

## 2011-10-19 NOTE — Telephone Encounter (Signed)
Scheduled with Dr. Christella Hartigan at on 10/22/11 at 9:45/10:00 AM. Patient aware. Chart ordered for [facility]

## 2011-10-22 ENCOUNTER — Ambulatory Visit (INDEPENDENT_AMBULATORY_CARE_PROVIDER_SITE_OTHER): Payer: Medicare Other | Admitting: Gastroenterology

## 2011-10-22 ENCOUNTER — Encounter: Payer: Self-pay | Admitting: Gastroenterology

## 2011-10-22 DIAGNOSIS — K649 Unspecified hemorrhoids: Secondary | ICD-10-CM

## 2011-10-22 MED ORDER — PRAMOXINE-HC 1-1 % EX CREA: TOPICAL_CREAM | CUTANEOUS | Status: AC

## 2011-10-22 NOTE — Progress Notes (Signed)
HPI: This is a  very pleasant 77 year old woman. Her primary care physician asked Dr Marina Goodell to see her this week however he is out of town for the next few days and then his schedule is full.  She saw Dr. Marina Goodell in the office and then underwent  screening colonoscopy 2004 with Dr. Marina Goodell.  Diverticluosis, 2 subcentimeter polyps both removed.  Adenomas on path.  Was recommended for recall colonoscopy at 3 years (would have been 2007).  Has red blood with BM and and even with coughing on a single occasion.  She first noticed it several months ago, then also last week.  She saw red blood 4-5 times last week.  Never constipated.   She has been wearing a pad  Hb last week was 13.0 (mcv 92).    She has ''protruding hemorrhoids),  Has worn a pad recently.  She was told to stop her coumadin for 5-7 days.  (INR was 2.4 one week ago).   Review of systems: Pertinent positive and negative review of systems were noted in the above HPI section. Complete review of systems was performed and was otherwise normal.    Past Medical History  Diagnosis Date  . CAD (coronary artery disease)   . Factor V Leiden   . GERD (gastroesophageal reflux disease)   . Multinodular goiter   . Internal hemorrhoids   . Hyperlipidemia   . Hypertension   . Nephrolithiasis   . Osteopenia   . Phlebitis   . PVD (peripheral vascular disease)   . Varicose vein   . Vitamin D deficiency   . DDD (degenerative disc disease), lumbar   . Goiter     Past Surgical History  Procedure Date  . Total abdominal hysterectomy   . Bladder repair   . Coronary artery bypass graft     Current Outpatient Prescriptions  Medication Sig Dispense Refill  . Calcium Carbonate-Vitamin D (CALCIUM-D PO) Take 1 capsule by mouth 2 (two) times daily.        . Cholecalciferol (VITAMIN D) 2000 UNITS CAPS Take 1 capsule by mouth daily.        . cyclobenzaprine (FLEXERIL) 10 MG tablet Take 10 mg by mouth 2 (two) times daily as needed.        .  hydrochlorothiazide (HYDRODIURIL) 25 MG tablet Take 25 mg by mouth daily.        . Misc Natural Products (OSTEO BI-FLEX ADV DOUBLE ST) CAPS Take 2 capsules by mouth daily.        . Multiple Vitamins-Minerals (MULTIVITAMIN WITH MINERALS) tablet Take 1 tablet by mouth daily.        Marland Kitchen olmesartan (BENICAR) 20 MG tablet Take 20 mg by mouth daily.        . Omega-3 Fatty Acids (FISH OIL) 1000 MG CAPS Take 1 capsule by mouth daily.        . pantoprazole (PROTONIX) 40 MG tablet Take 40 mg by mouth daily.        . potassium chloride SA (K-DUR,KLOR-CON) 20 MEQ tablet Take 20 mEq by mouth daily.        . simvastatin (ZOCOR) 40 MG tablet Take 40 mg by mouth at bedtime.        . Teriparatide, Recombinant, (FORTEO) 600 MCG/2.4ML SOLN Inject into the skin. As directed       . warfarin (COUMADIN) 5 MG tablet Take 5 mg by mouth daily.          Allergies as of 10/22/2011 - Review  Complete 10/22/2011  Allergen Reaction Noted  . Ivp dye (iodinated diagnostic agents)  10/22/2011    Family History  Problem Relation Age of Onset  . Coronary artery disease Mother   . Alzheimer's disease Sister   . Heart disease Brother     History   Social History  . Marital Status: Married    Spouse Name: N/A    Number of Children: 2  . Years of Education: N/A   Occupational History  . Retired    Social History Main Topics  . Smoking status: Never Smoker   . Smokeless tobacco: Never Used  . Alcohol Use: Yes     occ  . Drug Use: No  . Sexually Active: Not on file   Other Topics Concern  . Not on file   Social History Narrative  . No narrative on file       Physical Exam: BP 110/58  Pulse 60  Ht 5\' 2"  (1.575 m)  Wt 129 lb (58.514 kg)  BMI 23.59 kg/m2 Constitutional: generally well-appearing Psychiatric: alert and oriented x3 Eyes: extraocular movements intact Mouth: oral pharynx moist, no lesions Neck: supple no lymphadenopathy Cardiovascular: heart regular rate and rhythm Lungs: clear to  auscultation bilaterally Abdomen: soft, nontender, nondistended, no obvious ascites, no peritoneal signs, normal bowel sounds Extremities: no lower extremity edema bilaterally Skin: no lesions on visible extremities  rectal examination with female assistant in the room : Small to medium external hemorrhoids with feeling of also internal anal hemorrhoids on examination. No internal rectal masses, there appeared to be a small fissure-like abnormality midline posterior anus. Stool was brown and was Hemoccult negative    Assessment and plan: 77 y.o. female with  Minor recent rectal bleeding likely hemorrhoidal related, ongoing Coumadin therapy.    I have instructed her to start taking fiber supplements once daily to bulk her stool. This can help decrease anal trauma associated with bowel movements, excessive wiping. I have also given her a prescription for a topical hemorrhoidal ointment. She'll return to see Dr. Wendie Agreste in the office in 4 weeks.   Her bleeding was minor, she was anemic. I think she can restart her Coumadin in another 3 or 4 days.

## 2011-10-22 NOTE — Patient Instructions (Addendum)
Please start taking citrucel (orange flavored) powder fiber supplement.  This may cause some bloating at first but that usually goes away. Begin with a small spoonful and work your way up to a large, heaping spoonful daily over a week. Prescription hemorrhoid ointment started, use this once daily for the next 2 weeks. Return to see Dr. Marina Goodell in 4 weeks (to check for continued bleeding, plan for colonoscopy or flex sigmoidoscopy). You can resume you coumadin in 4 days.

## 2011-11-16 ENCOUNTER — Encounter: Payer: Self-pay | Admitting: Cardiology

## 2011-11-16 ENCOUNTER — Ambulatory Visit (INDEPENDENT_AMBULATORY_CARE_PROVIDER_SITE_OTHER): Payer: Medicare Other | Admitting: Cardiology

## 2011-11-16 VITALS — BP 144/70 | HR 79 | Ht 62.0 in | Wt 130.0 lb

## 2011-11-16 DIAGNOSIS — I10 Essential (primary) hypertension: Secondary | ICD-10-CM

## 2011-11-16 DIAGNOSIS — E785 Hyperlipidemia, unspecified: Secondary | ICD-10-CM | POA: Insufficient documentation

## 2011-11-16 DIAGNOSIS — D6859 Other primary thrombophilia: Secondary | ICD-10-CM

## 2011-11-16 DIAGNOSIS — D6851 Activated protein C resistance: Secondary | ICD-10-CM

## 2011-11-16 DIAGNOSIS — I2510 Atherosclerotic heart disease of native coronary artery without angina pectoris: Secondary | ICD-10-CM

## 2011-11-16 NOTE — Assessment & Plan Note (Signed)
She is on chronic anticoagulation with Coumadin.

## 2011-11-16 NOTE — Assessment & Plan Note (Signed)
Blood pressure is mildly elevated today. She is going to monitor her blood pressure more closely at home.

## 2011-11-16 NOTE — Patient Instructions (Signed)
Continue your current therapy  I will see you again in 1 year.   

## 2011-11-16 NOTE — Assessment & Plan Note (Signed)
She remains asymptomatic. Her last stress test was in  December of 2009. Given her advanced age I would not recommend routine followup testing unless she had new symptoms. We will continue with her risk factor modification and current medications.

## 2011-11-16 NOTE — Progress Notes (Signed)
Evalee Jefferson Date of Birth: 12/29/1922 Medical Record #[id number]  History of Present Illness: Mrs. Fincher is seen for yearly followup. She has a history of coronary disease and is status post CABG in 2004. She also has a history of factor V Leiden deficiency and is on chronic Coumadin. She reports that she has been doing well over the past year. Most of her time is spent caring for her invalid husband. She finds very little time to exercise. Dr. Timothy Lasso monitors her cholesterol and Coumadin.   Current Outpatient Prescriptions on File Prior to Visit  Medication Sig Dispense Refill  . Cholecalciferol (VITAMIN D) 2000 UNITS CAPS Take 1 capsule by mouth daily.        . cyclobenzaprine (FLEXERIL) 10 MG tablet Take 10 mg by mouth 2 (two) times daily as needed.        . hydrochlorothiazide (HYDRODIURIL) 25 MG tablet Take 25 mg by mouth daily.        . Misc Natural Products (OSTEO BI-FLEX ADV DOUBLE ST) CAPS Take 2 capsules by mouth daily.        . Multiple Vitamins-Minerals (MULTIVITAMIN WITH MINERALS) tablet Take 1 tablet by mouth daily.        Marland Kitchen olmesartan (BENICAR) 20 MG tablet Take 20 mg by mouth daily.        . Omega-3 Fatty Acids (FISH OIL) 1000 MG CAPS Take 1 capsule by mouth daily.        . pantoprazole (PROTONIX) 40 MG tablet Take 40 mg by mouth daily.        . potassium chloride SA (K-DUR,KLOR-CON) 20 MEQ tablet Take 20 mEq by mouth daily.        . pramoxine-hydrocortisone (ANALPRAM HC) cream Apply once daily to your hemorrhoids  30 g  1  . simvastatin (ZOCOR) 40 MG tablet Take 40 mg by mouth at bedtime.        . Teriparatide, Recombinant, (FORTEO) 600 MCG/2.4ML SOLN Inject into the skin. As directed       . warfarin (COUMADIN) 5 MG tablet Take 5 mg by mouth daily.          Allergies  Allergen Reactions  . Ivp Dye (Iodinated Diagnostic Agents)     Past Medical History  Diagnosis Date  . CAD (coronary artery disease)   . Factor V Leiden   . GERD (gastroesophageal reflux disease)    . Multinodular goiter   . Internal hemorrhoids   . Hyperlipidemia   . Hypertension   . Nephrolithiasis   . Osteopenia   . Phlebitis   . PVD (peripheral vascular disease)   . Varicose vein   . Vitamin D deficiency   . DDD (degenerative disc disease), lumbar     Past Surgical History  Procedure Date  . Total abdominal hysterectomy   . Bladder repair   . Coronary artery bypass graft   . Coronary artery bypass graft 2004    lima-lad,rima-rca,lradial-om1&2    History  Smoking status  . Never Smoker   Smokeless tobacco  . Never Used    History  Alcohol Use  . Yes    occ    Family History  Problem Relation Age of Onset  . Coronary artery disease Mother   . Alzheimer's disease Sister   . Heart disease Brother   . Heart attack Brother   . Heart attack Brother     Review of Systems: The review of systems is positive for  stress related to her caretaker role .  All other systems were reviewed and are negative.  Physical Exam: BP 144/70  Pulse 79  Ht 5\' 2"  (1.575 m)  Wt 130 lb (58.968 kg)  BMI 23.78 kg/m2  she is a pleasant elderly white female in no acute distress. She is normocephalic, atraumatic. Pupils are equal round and reactive to light accommodation. Sclera clear. Oropharynx is clear. Neck is supple no JVD, adenopathy, thyromegaly, or bruits. Lungs are clear. Cardiac exam reveals a regular rate and rhythm without gallop, murmur, or click. She has good pedal pulses. She does have some superficial varicosities in her lower extremities. She is alert and oriented x4. Cranial nerves II through XII are intact.  LABORATORY DATA: ECG today demonstrates normal sinus rhythm with an incomplete right bundle branch block. This is unchanged from 2010.   Assessment / Plan:

## 2011-11-21 ENCOUNTER — Encounter: Payer: Self-pay | Admitting: Internal Medicine

## 2011-11-21 ENCOUNTER — Ambulatory Visit (INDEPENDENT_AMBULATORY_CARE_PROVIDER_SITE_OTHER): Payer: Medicare Other | Admitting: Internal Medicine

## 2011-11-21 DIAGNOSIS — K625 Hemorrhage of anus and rectum: Secondary | ICD-10-CM

## 2011-11-21 DIAGNOSIS — R159 Full incontinence of feces: Secondary | ICD-10-CM

## 2011-11-21 DIAGNOSIS — K648 Other hemorrhoids: Secondary | ICD-10-CM

## 2011-11-21 DIAGNOSIS — K644 Residual hemorrhoidal skin tags: Secondary | ICD-10-CM

## 2011-11-21 MED ORDER — HYDROCORTISONE ACETATE 25 MG RE SUPP: 25.0000 mg | Freq: Every evening | RECTAL | Status: AC | PRN

## 2011-11-21 NOTE — Patient Instructions (Signed)
We have sent the following medications to your pharmacy for you to pick up at your convenience:  Anusol suppositories  

## 2011-11-21 NOTE — Progress Notes (Signed)
HISTORY OF PRESENT ILLNESS:  Heather Alvarado is a 77 y.o. female with multiple local problems as listed below. Heather Alvarado was seen 4 weeks ago for rectal bleeding. Heather Alvarado did dictation. Heather Alvarado was diagnosed with hemorrhoids. Heather Alvarado was treated with fiber supplementation in the form of Citrucel. Heather Alvarado presents today for followup. Heather Alvarado did undergo complete colonoscopy in 2004 with diminutive polyps and mild diverticulosis. Also internal hemorrhoids. Heather Alvarado started fiber, Heather Alvarado reports improvement in her bowel habits. Also, no further bleeding. Finally, a less rectal seepage. No new GI complaints.  REVIEW OF SYSTEMS:  All non-GI ROS negative .  Past Medical History  Diagnosis Date  . CAD (coronary artery disease)   . Factor V Leiden   . GERD (gastroesophageal reflux disease)   . Multinodular goiter   . Internal hemorrhoids   . Hyperlipidemia   . Hypertension   . Nephrolithiasis   . Osteopenia   . Phlebitis   . PVD (peripheral vascular disease)   . Varicose vein   . Vitamin D deficiency   . DDD (degenerative disc disease), lumbar     Past Surgical History  Procedure Date  . Total abdominal hysterectomy   . Bladder repair   . Coronary artery bypass graft   . Coronary artery bypass graft 2004    lima-lad,rima-rca,lradial-om1&2    Social History [name]  reports that Heather Alvarado has never smoked. Heather Alvarado has never used smokeless tobacco. Heather Alvarado reports that Heather Alvarado drinks alcohol. Heather Alvarado reports that Heather Alvarado does not use illicit drugs.  family history includes Alzheimer's disease in her sister; Coronary artery disease in her mother; Heart attack in her brothers; and Heart disease in her brother.  Allergies  Allergen Reactions  . Ivp Dye (Iodinated Diagnostic Agents)        PHYSICAL EXAMINATION: Vital signs: BP 134/58  Pulse 80  Ht 5\' 2"  (1.575 m)  Wt 131 lb 12.8 oz (59.784 kg)  BMI 24.11 kg/m2  Constitutional: generally well-appearing, no acute distress Psychiatric: alert and oriented x3, cooperative Eyes:  extraocular movements intact, anicteric, conjunctiva pink Mouth: oral pharynx moist, no lesions Neck: supple no lymphadenopathy Cardiovascular: heart regular rate and rhythm, no murmur Lungs: clear to auscultation bilaterally Abdomen: soft, nontender, nondistended, no obvious ascites, no peritoneal signs, normal bowel sounds, no organomegaly Rectal: External and internal hemorrhoids. Somewhat inflamed. Hemoccult-negative stool Extremities: no lower extremity edema bilaterally Skin: no lesions on visible extremities Neuro: No focal deficits.   ASSESSMENT:  #1. Recent problems with rectal bleeding secondary to hemorrhoids #2. Internal and external hemorrhoids with complications. Still with inflammation on physical exam #3. History of colon polyps, diminutive, 2004.     PLAN:  #1. Continue Citrucel fiber daily #2. Prescribe Anusol-HC suppositories one at night for 2 weeks. Thereafter, Heather Alvarado continues on demand as needed. #3. GI followup when necessary

## 2011-12-19 DIAGNOSIS — D682 Hereditary deficiency of other clotting factors: Secondary | ICD-10-CM | POA: Diagnosis not present

## 2011-12-19 DIAGNOSIS — Z7901 Long term (current) use of anticoagulants: Secondary | ICD-10-CM | POA: Diagnosis not present

## 2012-01-22 DIAGNOSIS — Z7901 Long term (current) use of anticoagulants: Secondary | ICD-10-CM | POA: Diagnosis not present

## 2012-01-22 DIAGNOSIS — D682 Hereditary deficiency of other clotting factors: Secondary | ICD-10-CM | POA: Diagnosis not present

## 2012-02-05 DIAGNOSIS — D682 Hereditary deficiency of other clotting factors: Secondary | ICD-10-CM | POA: Diagnosis not present

## 2012-02-05 DIAGNOSIS — Z7901 Long term (current) use of anticoagulants: Secondary | ICD-10-CM | POA: Diagnosis not present

## 2012-02-14 DIAGNOSIS — E559 Vitamin D deficiency, unspecified: Secondary | ICD-10-CM | POA: Diagnosis not present

## 2012-02-14 DIAGNOSIS — M810 Age-related osteoporosis without current pathological fracture: Secondary | ICD-10-CM | POA: Diagnosis not present

## 2012-02-14 DIAGNOSIS — E785 Hyperlipidemia, unspecified: Secondary | ICD-10-CM | POA: Diagnosis not present

## 2012-02-14 DIAGNOSIS — E079 Disorder of thyroid, unspecified: Secondary | ICD-10-CM | POA: Diagnosis not present

## 2012-02-14 DIAGNOSIS — I10 Essential (primary) hypertension: Secondary | ICD-10-CM | POA: Diagnosis not present

## 2012-02-14 DIAGNOSIS — E042 Nontoxic multinodular goiter: Secondary | ICD-10-CM | POA: Diagnosis not present

## 2012-02-14 DIAGNOSIS — R82998 Other abnormal findings in urine: Secondary | ICD-10-CM | POA: Diagnosis not present

## 2012-02-20 DIAGNOSIS — J309 Allergic rhinitis, unspecified: Secondary | ICD-10-CM | POA: Diagnosis not present

## 2012-02-20 DIAGNOSIS — M810 Age-related osteoporosis without current pathological fracture: Secondary | ICD-10-CM | POA: Diagnosis not present

## 2012-02-20 DIAGNOSIS — Z0000 Encounter for general adult medical examination without abnormal findings: Secondary | ICD-10-CM | POA: Diagnosis not present

## 2012-02-20 DIAGNOSIS — M545 Low back pain: Secondary | ICD-10-CM | POA: Diagnosis not present

## 2012-03-04 DIAGNOSIS — Z7901 Long term (current) use of anticoagulants: Secondary | ICD-10-CM | POA: Diagnosis not present

## 2012-03-04 DIAGNOSIS — D682 Hereditary deficiency of other clotting factors: Secondary | ICD-10-CM | POA: Diagnosis not present

## 2012-04-01 DIAGNOSIS — D682 Hereditary deficiency of other clotting factors: Secondary | ICD-10-CM | POA: Diagnosis not present

## 2012-04-01 DIAGNOSIS — Z7901 Long term (current) use of anticoagulants: Secondary | ICD-10-CM | POA: Diagnosis not present

## 2012-04-30 DIAGNOSIS — Z7901 Long term (current) use of anticoagulants: Secondary | ICD-10-CM | POA: Diagnosis not present

## 2012-04-30 DIAGNOSIS — D682 Hereditary deficiency of other clotting factors: Secondary | ICD-10-CM | POA: Diagnosis not present

## 2012-06-03 DIAGNOSIS — D682 Hereditary deficiency of other clotting factors: Secondary | ICD-10-CM | POA: Diagnosis not present

## 2012-06-03 DIAGNOSIS — Z7901 Long term (current) use of anticoagulants: Secondary | ICD-10-CM | POA: Diagnosis not present

## 2012-07-03 DIAGNOSIS — Z7901 Long term (current) use of anticoagulants: Secondary | ICD-10-CM | POA: Diagnosis not present

## 2012-07-03 DIAGNOSIS — D682 Hereditary deficiency of other clotting factors: Secondary | ICD-10-CM | POA: Diagnosis not present

## 2012-07-16 DIAGNOSIS — D682 Hereditary deficiency of other clotting factors: Secondary | ICD-10-CM | POA: Diagnosis not present

## 2012-07-16 DIAGNOSIS — Z7901 Long term (current) use of anticoagulants: Secondary | ICD-10-CM | POA: Diagnosis not present

## 2012-08-12 DIAGNOSIS — D682 Hereditary deficiency of other clotting factors: Secondary | ICD-10-CM | POA: Diagnosis not present

## 2012-08-12 DIAGNOSIS — Z7901 Long term (current) use of anticoagulants: Secondary | ICD-10-CM | POA: Diagnosis not present

## 2012-08-26 ENCOUNTER — Encounter: Payer: Self-pay | Admitting: Cardiology

## 2012-08-28 DIAGNOSIS — E785 Hyperlipidemia, unspecified: Secondary | ICD-10-CM | POA: Diagnosis not present

## 2012-08-28 DIAGNOSIS — E042 Nontoxic multinodular goiter: Secondary | ICD-10-CM | POA: Diagnosis not present

## 2012-08-28 DIAGNOSIS — Z7901 Long term (current) use of anticoagulants: Secondary | ICD-10-CM | POA: Diagnosis not present

## 2012-08-28 DIAGNOSIS — Z23 Encounter for immunization: Secondary | ICD-10-CM | POA: Diagnosis not present

## 2012-08-28 DIAGNOSIS — M545 Low back pain: Secondary | ICD-10-CM | POA: Diagnosis not present

## 2012-09-03 DIAGNOSIS — M949 Disorder of cartilage, unspecified: Secondary | ICD-10-CM | POA: Diagnosis not present

## 2012-09-03 DIAGNOSIS — M899 Disorder of bone, unspecified: Secondary | ICD-10-CM | POA: Diagnosis not present

## 2012-10-01 DIAGNOSIS — Z7901 Long term (current) use of anticoagulants: Secondary | ICD-10-CM | POA: Diagnosis not present

## 2012-10-01 DIAGNOSIS — M810 Age-related osteoporosis without current pathological fracture: Secondary | ICD-10-CM | POA: Diagnosis not present

## 2012-10-01 DIAGNOSIS — D682 Hereditary deficiency of other clotting factors: Secondary | ICD-10-CM | POA: Diagnosis not present

## 2012-10-30 DIAGNOSIS — Z7901 Long term (current) use of anticoagulants: Secondary | ICD-10-CM | POA: Diagnosis not present

## 2012-10-30 DIAGNOSIS — D682 Hereditary deficiency of other clotting factors: Secondary | ICD-10-CM | POA: Diagnosis not present

## 2012-11-20 DIAGNOSIS — H2510 Age-related nuclear cataract, unspecified eye: Secondary | ICD-10-CM | POA: Diagnosis not present

## 2012-11-28 DIAGNOSIS — Z7901 Long term (current) use of anticoagulants: Secondary | ICD-10-CM | POA: Diagnosis not present

## 2012-11-28 DIAGNOSIS — D682 Hereditary deficiency of other clotting factors: Secondary | ICD-10-CM | POA: Diagnosis not present

## 2013-01-14 DIAGNOSIS — D682 Hereditary deficiency of other clotting factors: Secondary | ICD-10-CM | POA: Diagnosis not present

## 2013-01-14 DIAGNOSIS — Z7901 Long term (current) use of anticoagulants: Secondary | ICD-10-CM | POA: Diagnosis not present

## 2013-02-11 DIAGNOSIS — Z7901 Long term (current) use of anticoagulants: Secondary | ICD-10-CM | POA: Diagnosis not present

## 2013-02-11 DIAGNOSIS — D682 Hereditary deficiency of other clotting factors: Secondary | ICD-10-CM | POA: Diagnosis not present

## 2013-02-18 DIAGNOSIS — R82998 Other abnormal findings in urine: Secondary | ICD-10-CM | POA: Diagnosis not present

## 2013-02-18 DIAGNOSIS — I739 Peripheral vascular disease, unspecified: Secondary | ICD-10-CM | POA: Diagnosis not present

## 2013-02-18 DIAGNOSIS — I10 Essential (primary) hypertension: Secondary | ICD-10-CM | POA: Diagnosis not present

## 2013-02-18 DIAGNOSIS — I2510 Atherosclerotic heart disease of native coronary artery without angina pectoris: Secondary | ICD-10-CM | POA: Diagnosis not present

## 2013-02-18 DIAGNOSIS — E785 Hyperlipidemia, unspecified: Secondary | ICD-10-CM | POA: Diagnosis not present

## 2013-02-18 DIAGNOSIS — E042 Nontoxic multinodular goiter: Secondary | ICD-10-CM | POA: Diagnosis not present

## 2013-02-18 DIAGNOSIS — E559 Vitamin D deficiency, unspecified: Secondary | ICD-10-CM | POA: Diagnosis not present

## 2013-02-26 DIAGNOSIS — D682 Hereditary deficiency of other clotting factors: Secondary | ICD-10-CM | POA: Diagnosis not present

## 2013-02-26 DIAGNOSIS — M810 Age-related osteoporosis without current pathological fracture: Secondary | ICD-10-CM | POA: Diagnosis not present

## 2013-02-26 DIAGNOSIS — K219 Gastro-esophageal reflux disease without esophagitis: Secondary | ICD-10-CM | POA: Diagnosis not present

## 2013-02-26 DIAGNOSIS — M545 Low back pain: Secondary | ICD-10-CM | POA: Diagnosis not present

## 2013-02-26 DIAGNOSIS — Z7901 Long term (current) use of anticoagulants: Secondary | ICD-10-CM | POA: Diagnosis not present

## 2013-02-26 DIAGNOSIS — E079 Disorder of thyroid, unspecified: Secondary | ICD-10-CM | POA: Diagnosis not present

## 2013-02-26 DIAGNOSIS — Z0000 Encounter for general adult medical examination without abnormal findings: Secondary | ICD-10-CM | POA: Diagnosis not present

## 2013-02-26 DIAGNOSIS — I2510 Atherosclerotic heart disease of native coronary artery without angina pectoris: Secondary | ICD-10-CM | POA: Diagnosis not present

## 2013-03-03 DIAGNOSIS — D682 Hereditary deficiency of other clotting factors: Secondary | ICD-10-CM | POA: Diagnosis not present

## 2013-03-03 DIAGNOSIS — Z7901 Long term (current) use of anticoagulants: Secondary | ICD-10-CM | POA: Diagnosis not present

## 2013-03-17 DIAGNOSIS — I824Z9 Acute embolism and thrombosis of unspecified deep veins of unspecified distal lower extremity: Secondary | ICD-10-CM | POA: Diagnosis not present

## 2013-03-17 DIAGNOSIS — Z7901 Long term (current) use of anticoagulants: Secondary | ICD-10-CM | POA: Diagnosis not present

## 2013-03-17 DIAGNOSIS — D682 Hereditary deficiency of other clotting factors: Secondary | ICD-10-CM | POA: Diagnosis not present

## 2013-04-15 DIAGNOSIS — Z7901 Long term (current) use of anticoagulants: Secondary | ICD-10-CM | POA: Diagnosis not present

## 2013-04-15 DIAGNOSIS — D682 Hereditary deficiency of other clotting factors: Secondary | ICD-10-CM | POA: Diagnosis not present

## 2013-05-05 DIAGNOSIS — Z7901 Long term (current) use of anticoagulants: Secondary | ICD-10-CM | POA: Diagnosis not present

## 2013-05-05 DIAGNOSIS — D682 Hereditary deficiency of other clotting factors: Secondary | ICD-10-CM | POA: Diagnosis not present

## 2013-06-16 DIAGNOSIS — Z7901 Long term (current) use of anticoagulants: Secondary | ICD-10-CM | POA: Diagnosis not present

## 2013-06-16 DIAGNOSIS — D682 Hereditary deficiency of other clotting factors: Secondary | ICD-10-CM | POA: Diagnosis not present

## 2013-07-14 DIAGNOSIS — D682 Hereditary deficiency of other clotting factors: Secondary | ICD-10-CM | POA: Diagnosis not present

## 2013-07-14 DIAGNOSIS — Z7901 Long term (current) use of anticoagulants: Secondary | ICD-10-CM | POA: Diagnosis not present

## 2013-08-11 DIAGNOSIS — Z7901 Long term (current) use of anticoagulants: Secondary | ICD-10-CM | POA: Diagnosis not present

## 2013-08-11 DIAGNOSIS — Z23 Encounter for immunization: Secondary | ICD-10-CM | POA: Diagnosis not present

## 2013-08-11 DIAGNOSIS — D682 Hereditary deficiency of other clotting factors: Secondary | ICD-10-CM | POA: Diagnosis not present

## 2013-09-01 DIAGNOSIS — M810 Age-related osteoporosis without current pathological fracture: Secondary | ICD-10-CM | POA: Diagnosis not present

## 2013-09-01 DIAGNOSIS — Z1331 Encounter for screening for depression: Secondary | ICD-10-CM | POA: Diagnosis not present

## 2013-09-01 DIAGNOSIS — I2510 Atherosclerotic heart disease of native coronary artery without angina pectoris: Secondary | ICD-10-CM | POA: Diagnosis not present

## 2013-09-01 DIAGNOSIS — D682 Hereditary deficiency of other clotting factors: Secondary | ICD-10-CM | POA: Diagnosis not present

## 2013-09-01 DIAGNOSIS — E785 Hyperlipidemia, unspecified: Secondary | ICD-10-CM | POA: Diagnosis not present

## 2013-09-01 DIAGNOSIS — M545 Low back pain: Secondary | ICD-10-CM | POA: Diagnosis not present

## 2013-09-01 DIAGNOSIS — Z7901 Long term (current) use of anticoagulants: Secondary | ICD-10-CM | POA: Diagnosis not present

## 2013-09-01 DIAGNOSIS — M1990 Unspecified osteoarthritis, unspecified site: Secondary | ICD-10-CM | POA: Diagnosis not present

## 2013-09-01 DIAGNOSIS — E042 Nontoxic multinodular goiter: Secondary | ICD-10-CM | POA: Diagnosis not present

## 2013-09-01 DIAGNOSIS — K219 Gastro-esophageal reflux disease without esophagitis: Secondary | ICD-10-CM | POA: Diagnosis not present

## 2013-09-01 DIAGNOSIS — M899 Disorder of bone, unspecified: Secondary | ICD-10-CM | POA: Diagnosis not present

## 2013-09-09 DIAGNOSIS — M810 Age-related osteoporosis without current pathological fracture: Secondary | ICD-10-CM | POA: Diagnosis not present

## 2013-09-09 DIAGNOSIS — Z7901 Long term (current) use of anticoagulants: Secondary | ICD-10-CM | POA: Diagnosis not present

## 2013-09-09 DIAGNOSIS — D682 Hereditary deficiency of other clotting factors: Secondary | ICD-10-CM | POA: Diagnosis not present

## 2013-10-07 DIAGNOSIS — Z7901 Long term (current) use of anticoagulants: Secondary | ICD-10-CM | POA: Diagnosis not present

## 2013-10-07 DIAGNOSIS — D682 Hereditary deficiency of other clotting factors: Secondary | ICD-10-CM | POA: Diagnosis not present

## 2013-10-28 DIAGNOSIS — D682 Hereditary deficiency of other clotting factors: Secondary | ICD-10-CM | POA: Diagnosis not present

## 2013-10-28 DIAGNOSIS — Z7901 Long term (current) use of anticoagulants: Secondary | ICD-10-CM | POA: Diagnosis not present

## 2013-11-25 DIAGNOSIS — Z7901 Long term (current) use of anticoagulants: Secondary | ICD-10-CM | POA: Diagnosis not present

## 2013-11-25 DIAGNOSIS — D682 Hereditary deficiency of other clotting factors: Secondary | ICD-10-CM | POA: Diagnosis not present

## 2013-12-16 DIAGNOSIS — Z7901 Long term (current) use of anticoagulants: Secondary | ICD-10-CM | POA: Diagnosis not present

## 2013-12-16 DIAGNOSIS — D682 Hereditary deficiency of other clotting factors: Secondary | ICD-10-CM | POA: Diagnosis not present

## 2013-12-26 DIAGNOSIS — B029 Zoster without complications: Secondary | ICD-10-CM | POA: Diagnosis not present

## 2014-01-04 DIAGNOSIS — R300 Dysuria: Secondary | ICD-10-CM | POA: Diagnosis not present

## 2014-01-04 DIAGNOSIS — B029 Zoster without complications: Secondary | ICD-10-CM | POA: Diagnosis not present

## 2014-01-04 DIAGNOSIS — Z7901 Long term (current) use of anticoagulants: Secondary | ICD-10-CM | POA: Diagnosis not present

## 2014-01-04 DIAGNOSIS — D682 Hereditary deficiency of other clotting factors: Secondary | ICD-10-CM | POA: Diagnosis not present

## 2014-01-11 DIAGNOSIS — K644 Residual hemorrhoidal skin tags: Secondary | ICD-10-CM | POA: Diagnosis not present

## 2014-01-11 DIAGNOSIS — IMO0002 Reserved for concepts with insufficient information to code with codable children: Secondary | ICD-10-CM | POA: Diagnosis not present

## 2014-01-11 DIAGNOSIS — B0229 Other postherpetic nervous system involvement: Secondary | ICD-10-CM | POA: Diagnosis not present

## 2014-01-12 ENCOUNTER — Ambulatory Visit (INDEPENDENT_AMBULATORY_CARE_PROVIDER_SITE_OTHER): Payer: Medicare Other | Admitting: Cardiology

## 2014-01-12 ENCOUNTER — Encounter (INDEPENDENT_AMBULATORY_CARE_PROVIDER_SITE_OTHER): Payer: Self-pay

## 2014-01-12 ENCOUNTER — Encounter: Payer: Self-pay | Admitting: Cardiology

## 2014-01-12 VITALS — BP 150/56 | HR 90 | Ht 62.0 in | Wt 131.0 lb

## 2014-01-12 DIAGNOSIS — D6859 Other primary thrombophilia: Secondary | ICD-10-CM

## 2014-01-12 DIAGNOSIS — D6851 Activated protein C resistance: Secondary | ICD-10-CM

## 2014-01-12 DIAGNOSIS — I10 Essential (primary) hypertension: Secondary | ICD-10-CM

## 2014-01-12 DIAGNOSIS — I739 Peripheral vascular disease, unspecified: Secondary | ICD-10-CM

## 2014-01-12 DIAGNOSIS — I2510 Atherosclerotic heart disease of native coronary artery without angina pectoris: Secondary | ICD-10-CM

## 2014-01-12 DIAGNOSIS — E785 Hyperlipidemia, unspecified: Secondary | ICD-10-CM | POA: Diagnosis not present

## 2014-01-12 MED ORDER — NITROGLYCERIN 0.4 MG SL SUBL: 0.4000 mg | SUBLINGUAL_TABLET | SUBLINGUAL | Status: AC | PRN

## 2014-01-12 NOTE — Progress Notes (Signed)
[name] Date of Birth: Jun 27, 1923 Medical Record #[id number]  History of Present Illness: [name] is seen today to [facility]. Last seen 4 years ago. States she was doing well so didn't follow up. 3 days ago she developed symptoms of dizziness and chest pain. Pain was midsternal without radiation. Pain was significant to the point that she cried but then it resolved. Did not have Ntg on hand. No dyspnea or palpitations. She is s/p CABG in 2004 with LIMA graft to the LAD, RIMA graft to the RCA, and free radial graft to the first and second OM. She has a history of factor V Leiden deficiency and is on chronic coumadin. She has a history of HTN and hyperlipidemia. History of allergy to IVP dye with hives.  Current Outpatient Prescriptions on File Prior to Visit  Medication Sig Dispense Refill  . Cholecalciferol (VITAMIN D) 2000 UNITS CAPS Take 1 capsule by mouth daily.        . cyclobenzaprine (FLEXERIL) 10 MG tablet Take 10 mg by mouth 2 (two) times daily as needed.        . hydrochlorothiazide (HYDRODIURIL) 25 MG tablet Take 25 mg by mouth daily.        . Misc Natural Products (OSTEO BI-FLEX ADV DOUBLE ST) CAPS Take 2 capsules by mouth daily.        . Multiple Vitamins-Minerals (MULTIVITAMIN WITH MINERALS) tablet Take 1 tablet by mouth daily.        Marland Kitchen olmesartan (BENICAR) 20 MG tablet Take 20 mg by mouth daily.        . Omega-3 Fatty Acids (FISH OIL) 1000 MG CAPS Take 1 capsule by mouth daily.        . pantoprazole (PROTONIX) 40 MG tablet Take 40 mg by mouth daily.        . potassium chloride SA (K-DUR,KLOR-CON) 20 MEQ tablet Take 20 mEq by mouth daily.        . simvastatin (ZOCOR) 40 MG tablet Take 40 mg by mouth at bedtime.        . Teriparatide, Recombinant, (FORTEO) 600 MCG/2.4ML SOLN Inject into the skin. As directed       . warfarin (COUMADIN) 5 MG tablet Take 5 mg by mouth daily.         No current facility-administered medications on file prior to visit.     Allergies  Allergen Reactions  . Ivp Dye [Iodinated Diagnostic Agents] Hives    Past Medical History  Diagnosis Date  . CAD (coronary artery disease)   . Factor V Leiden   . GERD (gastroesophageal reflux disease)   . Multinodular goiter   . Internal hemorrhoids   . Hyperlipidemia   . Hypertension   . Nephrolithiasis   . Osteopenia   . Phlebitis   . PVD (peripheral vascular disease)   . Varicose vein   . Vitamin D deficiency   . DDD (degenerative disc disease), lumbar     Past Surgical History  Procedure Laterality Date  . Total abdominal hysterectomy    . Bladder repair    . Coronary artery bypass graft    . Coronary artery bypass graft  2004    lima-lad,rima-rca,lradial-om1&2  . Breast biopsy      History  Smoking status  . Never Smoker   Smokeless tobacco  . Never Used    History  Alcohol Use  . Yes    Comment: occ    Family History  Problem Relation Age of Onset  .  Coronary artery disease Mother   . Alzheimer's disease Sister   . Heart disease Brother   . Heart attack Brother   . Heart attack Brother     Review of Systems: The review of systems is positive for recent shingles outbreak in the lumbar area. She does note claudication symptoms particularly in her right leg with some chronic swelling and varicose veins. Recent constipation symptoms.  All other systems were reviewed and are negative.  Physical Exam: BP 150/56  Pulse 90  Ht 5\' 2"  (1.575 m)  Wt 131 lb (59.421 kg)  BMI 23.95 kg/m2 She is a pleasant elderly WF in NAD HEENT: [place]/AT, PERRL, EOMI. Sclera are clear. Oropharynx is clear. Neck: no JVD, adenopathy, thyromegaly, or bruits. Lungs: clear CV: RRR, normal S1-2. Soft 1/6 SEM. No gallop Abdomen: soft, NT, BS positive. No masses, bruits, or HSM Ext. Absent right femoral and pedal pulses. Trace edema on the right. Superficial varicosities. Skin: warm and dry Neuro: alert and oriented x 3. CN II-XII intact.  LABORATORY  DATA: ECG: NSR rate 81. RBBB-old, LAD  Assessment / Plan: 1. Chest pain. Concerning for angina pectoris. I have prescribed sl Ntg and instructed in its use. She is 11 years out from CABG. Will schedule her for a Lexiscan myoview.  2. PAD. Right leg claudication with absent pulse on right. Will check arterial dopplers.  3. CAD s/p CABG (all arterial conduits) in 2004.  4. Factor V Leiden deficiency- on chronic coumadin  5. Hyperlipidemia  6. HTN  7. IVP dye allergy-hives.

## 2014-01-12 NOTE — Patient Instructions (Signed)
We will schedule you for a nuclear stress test and arterial dopplers of your legs.   We will get records from Dr. Virgina Jock  I will see you back following these studies.

## 2014-01-13 ENCOUNTER — Ambulatory Visit (HOSPITAL_COMMUNITY): Payer: Medicare Other | Attending: Cardiology | Admitting: Radiology

## 2014-01-13 ENCOUNTER — Encounter: Payer: Self-pay | Admitting: Cardiology

## 2014-01-13 VITALS — BP 142/69 | HR 78 | Ht 62.0 in | Wt 129.0 lb

## 2014-01-13 DIAGNOSIS — Z8249 Family history of ischemic heart disease and other diseases of the circulatory system: Secondary | ICD-10-CM | POA: Diagnosis not present

## 2014-01-13 DIAGNOSIS — I739 Peripheral vascular disease, unspecified: Secondary | ICD-10-CM | POA: Diagnosis not present

## 2014-01-13 DIAGNOSIS — R079 Chest pain, unspecified: Secondary | ICD-10-CM | POA: Diagnosis not present

## 2014-01-13 DIAGNOSIS — E785 Hyperlipidemia, unspecified: Secondary | ICD-10-CM

## 2014-01-13 DIAGNOSIS — I10 Essential (primary) hypertension: Secondary | ICD-10-CM

## 2014-01-13 DIAGNOSIS — Z951 Presence of aortocoronary bypass graft: Secondary | ICD-10-CM | POA: Insufficient documentation

## 2014-01-13 DIAGNOSIS — R42 Dizziness and giddiness: Secondary | ICD-10-CM | POA: Insufficient documentation

## 2014-01-13 DIAGNOSIS — I2510 Atherosclerotic heart disease of native coronary artery without angina pectoris: Secondary | ICD-10-CM | POA: Insufficient documentation

## 2014-01-13 DIAGNOSIS — D6851 Activated protein C resistance: Secondary | ICD-10-CM

## 2014-01-13 MED ORDER — REGADENOSON 0.4 MG/5ML IV SOLN
0.4000 mg | Freq: Once | INTRAVENOUS | Status: AC
  Administered 2014-01-13: 0.4 mg via INTRAVENOUS

## 2014-01-13 MED ORDER — TECHNETIUM TC 99M SESTAMIBI GENERIC - CARDIOLITE
30.0000 | Freq: Once | INTRAVENOUS | Status: AC | PRN
  Administered 2014-01-13: 30 via INTRAVENOUS

## 2014-01-13 MED ORDER — TECHNETIUM TC 99M SESTAMIBI GENERIC - CARDIOLITE
10.0000 | Freq: Once | INTRAVENOUS | Status: AC | PRN
  Administered 2014-01-13: 10 via INTRAVENOUS

## 2014-01-13 NOTE — Progress Notes (Signed)
[facility] [address] [place], [place] [address] [phone]    Cardiology Nuclear Med Study  Heather Alvarado is a 80 y.o. female     MRN : [id number]     DOB: 1923/06/26  Procedure Date: 01/13/2014  Nuclear Med Background Indication for Stress Test:  Evaluation for Ischemia and Graft Patency History:  CABG 2004, CAD, MPI 2009 (normal) EF 77% Cardiac Risk Factors: Family History - CAD, Hypertension, Lipids and PVD  Symptoms:  Chest Pain and Dizziness   Nuclear Pre-Procedure Caffeine/Decaff Intake:  None NPO After: 5:00am   Lungs:  clear O2 Sat: 97% on room air. IV 0.9% NS with Angio Cath:  22g  IV Site: L Antecubital  IV Started by:  Perrin Maltese, EMT-P  Chest Size (in):  38 Cup Size: B  Height: 5\' 2"  (1.575 m)  Weight:  129 lb (58.514 kg)  BMI:  Body mass index is 23.59 kg/(m^2). Tech Comments:  Breakfast 5:00 am    Nuclear Med Study 1 or 2 day study: 1 day  Stress Test Type:  Lexiscan  Reading MD: n/a  Order Authorizing Provider:  P. [place]   Resting Radionuclide: Technetium 26m Sestamibi  Resting Radionuclide Dose: 11.0 mCi   Stress Radionuclide:  Technetium 16m Sestamibi  Stress Radionuclide Dose: 33.0 mCi           Stress Protocol Rest HR: 78 Stress HR: 104  Rest BP: 142/69 Stress BP: 138/48  Exercise Time (min): n/a METS: n/a           Dose of Adenosine (mg):  n/a Dose of Lexiscan: 0.4 mg  Dose of Atropine (mg): n/a Dose of Dobutamine: n/a mcg/kg/min (at max HR)  Stress Test Technologist: Glade Lloyd, BS-ES  Nuclear Technologist:  Vedia Pereyra, CNMT     Rest Procedure:  Myocardial perfusion imaging was performed at rest 45 minutes following the intravenous administration of Technetium 34m Sestamibi. Rest ECG: NSR-RBBB  Stress Procedure:  The patient received IV Lexiscan 0.4 mg over 15-seconds.  Technetium 27m Sestamibi injected at 30-seconds.  Quantitative spect images were obtained after a 45 minute delay.   During the infusion of Lexiscan, the patient complained of feeling weird.  She began to feel better in recovery.  Stress ECG: No significant ST segment change suggestive of ischemia.  QPS Raw Data Images:  Acquisition technically good; normal left ventricular size. Stress Images:  There is decreased uptake in the apex. Rest Images:  Normal homogeneous uptake in all areas of the myocardium. Subtraction (SDS):  These findings are consistent with ischemia. Transient Ischemic Dilatation (Normal <1.22):  1.01 Lung/Heart Ratio (Normal <0.45):  0.42  Quantitative Gated Spect Images QGS EDV:  45 ml QGS ESV:  4 ml  Impression Exercise Capacity:  Lexiscan with no exercise. BP Response:  Normal blood pressure response. Clinical Symptoms:  No chest pain or dyspnea. ECG Impression:  No significant ST segment change suggestive of ischemia. Comparison with Prior Nuclear Study: No previous nuclear study performed  Overall Impression:  Low risk stress nuclear study with a small, mild intensity, reversible apical defect consistent with mild ischemia.  LV Ejection Fraction: 91%.  LV Wall Motion:  NL LV Function; NL Wall Motion  Kirk Ruths

## 2014-01-14 ENCOUNTER — Telehealth: Payer: Self-pay | Admitting: Cardiology

## 2014-01-14 ENCOUNTER — Other Ambulatory Visit: Payer: Self-pay

## 2014-01-14 ENCOUNTER — Ambulatory Visit (HOSPITAL_COMMUNITY): Payer: Medicare Other | Attending: Cardiology

## 2014-01-14 ENCOUNTER — Encounter: Payer: Self-pay | Admitting: Cardiology

## 2014-01-14 DIAGNOSIS — Z951 Presence of aortocoronary bypass graft: Secondary | ICD-10-CM | POA: Insufficient documentation

## 2014-01-14 DIAGNOSIS — I2510 Atherosclerotic heart disease of native coronary artery without angina pectoris: Secondary | ICD-10-CM | POA: Insufficient documentation

## 2014-01-14 DIAGNOSIS — I739 Peripheral vascular disease, unspecified: Secondary | ICD-10-CM

## 2014-01-14 DIAGNOSIS — I10 Essential (primary) hypertension: Secondary | ICD-10-CM | POA: Diagnosis not present

## 2014-01-14 DIAGNOSIS — E785 Hyperlipidemia, unspecified: Secondary | ICD-10-CM | POA: Insufficient documentation

## 2014-01-14 DIAGNOSIS — D6851 Activated protein C resistance: Secondary | ICD-10-CM

## 2014-01-14 DIAGNOSIS — I70219 Atherosclerosis of native arteries of extremities with intermittent claudication, unspecified extremity: Secondary | ICD-10-CM | POA: Diagnosis not present

## 2014-01-14 MED ORDER — ISOSORBIDE MONONITRATE 15 MG HALF TABLET
15.0000 mg | ORAL_TABLET | Freq: Every day | ORAL | Status: DC
Start: 2014-01-14 — End: 2014-01-14

## 2014-01-14 MED ORDER — ISOSORBIDE MONONITRATE 15 MG HALF TABLET: 15.0000 mg | ORAL_TABLET | Freq: Every day | ORAL | Status: DC

## 2014-01-14 NOTE — Telephone Encounter (Signed)
Returned call to patient Heather Alvarado results given.Dr.Jordan advised to start Imdur 15 mg daily.Advised to keep appointment with Dr.Jordan 02/10/14.

## 2014-01-14 NOTE — Telephone Encounter (Signed)
°  Patient is returning your call, please call back.  °

## 2014-01-25 DIAGNOSIS — H2510 Age-related nuclear cataract, unspecified eye: Secondary | ICD-10-CM | POA: Diagnosis not present

## 2014-02-01 ENCOUNTER — Ambulatory Visit (INDEPENDENT_AMBULATORY_CARE_PROVIDER_SITE_OTHER): Payer: Medicare Other | Admitting: Surgery

## 2014-02-01 ENCOUNTER — Encounter (INDEPENDENT_AMBULATORY_CARE_PROVIDER_SITE_OTHER): Payer: Self-pay | Admitting: Surgery

## 2014-02-01 VITALS — BP 156/72 | HR 72 | Temp 98.0°F | Resp 14 | Ht 62.0 in | Wt 135.4 lb

## 2014-02-01 DIAGNOSIS — I10 Essential (primary) hypertension: Secondary | ICD-10-CM | POA: Diagnosis not present

## 2014-02-01 DIAGNOSIS — Z7901 Long term (current) use of anticoagulants: Secondary | ICD-10-CM | POA: Diagnosis not present

## 2014-02-01 DIAGNOSIS — K644 Residual hemorrhoidal skin tags: Secondary | ICD-10-CM

## 2014-02-01 DIAGNOSIS — Z6825 Body mass index (BMI) 25.0-25.9, adult: Secondary | ICD-10-CM | POA: Diagnosis not present

## 2014-02-01 DIAGNOSIS — D682 Hereditary deficiency of other clotting factors: Secondary | ICD-10-CM | POA: Diagnosis not present

## 2014-02-01 DIAGNOSIS — K648 Other hemorrhoids: Secondary | ICD-10-CM | POA: Diagnosis not present

## 2014-02-01 DIAGNOSIS — B0229 Other postherpetic nervous system involvement: Secondary | ICD-10-CM | POA: Diagnosis not present

## 2014-02-01 MED ORDER — LIDOCAINE 5 % EX OINT: 1.0000 "application " | TOPICAL_OINTMENT | CUTANEOUS | Status: DC | PRN

## 2014-02-01 MED ORDER — TRAMADOL HCL 50 MG PO TABS: 50.0000 mg | ORAL_TABLET | Freq: Four times a day (QID) | ORAL | Status: DC | PRN

## 2014-02-01 MED ORDER — HYDROCORTISONE ACE-PRAMOXINE 2.5-1 % RE CREA: 1.0000 "application " | TOPICAL_CREAM | Freq: Four times a day (QID) | RECTAL | Status: DC

## 2014-02-01 NOTE — Progress Notes (Signed)
Subjective:     Patient ID: Heather Alvarado, female   DOB: 04-25-1923, 80 y.o.   MRN: [id number]  HPI She is referred to me by Dr. Virgina Jock. She had a bout of shingles and then developed significant constipation which then Caused her to become constipated. She then developed hemorrhoids. She is currently on Coumadin.  Review of Systems     Objective:   Physical Exam On exam, there is excoriation of the anal skin at an anterior hemorrhoid.  There are separate tiny areas of thrombosis but no thrombosed large external hemorrhoid.    Assessment:     Inflamed hemorrhoids     Plan:     There is nothing that needs to be incised in the office today. I believe she needs continued sitz baths, stool softeners, steroid cream and lidocaine cream. I will also relieve her Ultram. I will see her back in 3 weeks unless she acutely worsens

## 2014-02-05 DIAGNOSIS — Z7901 Long term (current) use of anticoagulants: Secondary | ICD-10-CM | POA: Diagnosis not present

## 2014-02-05 DIAGNOSIS — K644 Residual hemorrhoidal skin tags: Secondary | ICD-10-CM | POA: Diagnosis not present

## 2014-02-05 DIAGNOSIS — K648 Other hemorrhoids: Secondary | ICD-10-CM | POA: Diagnosis not present

## 2014-02-05 DIAGNOSIS — D682 Hereditary deficiency of other clotting factors: Secondary | ICD-10-CM | POA: Diagnosis not present

## 2014-02-10 ENCOUNTER — Encounter: Payer: Self-pay | Admitting: Cardiology

## 2014-02-10 ENCOUNTER — Ambulatory Visit (INDEPENDENT_AMBULATORY_CARE_PROVIDER_SITE_OTHER): Payer: Medicare Other | Admitting: Cardiology

## 2014-02-10 VITALS — BP 136/54 | HR 72 | Ht 62.0 in | Wt 136.0 lb

## 2014-02-10 DIAGNOSIS — E785 Hyperlipidemia, unspecified: Secondary | ICD-10-CM | POA: Diagnosis not present

## 2014-02-10 DIAGNOSIS — I2510 Atherosclerotic heart disease of native coronary artery without angina pectoris: Secondary | ICD-10-CM

## 2014-02-10 DIAGNOSIS — D6859 Other primary thrombophilia: Secondary | ICD-10-CM

## 2014-02-10 DIAGNOSIS — I10 Essential (primary) hypertension: Secondary | ICD-10-CM | POA: Diagnosis not present

## 2014-02-10 DIAGNOSIS — D6851 Activated protein C resistance: Secondary | ICD-10-CM

## 2014-02-10 NOTE — Progress Notes (Signed)
Heather Alvarado Date of Birth: [birth year] Medical Record #[id number]  History of Present Illness: Heather Alvarado is seen today for follow up on her recent studies. She denies any more chest pain or dizziness. Hasn't really been walking at all. She is s/p CABG in 2004 with LIMA graft to the LAD, RIMA graft to the RCA, and free radial graft to the first and second OM. She has a history of factor V Leiden deficiency and is on chronic coumadin. She has a history of HTN and hyperlipidemia. History of allergy to IVP dye with hives. Recent LE arterial dopplers showed no significant blockage. Myoview study noted below.  Current Outpatient Prescriptions on File Prior to Visit  Medication Sig Dispense Refill  . calcium-vitamin D (OSCAL WITH D) 500-200 MG-UNIT per tablet Take 1 tablet by mouth.      . Cholecalciferol (VITAMIN D) 2000 UNITS CAPS Take 1 capsule by mouth daily.        . hydrochlorothiazide (HYDRODIURIL) 25 MG tablet Take 25 mg by mouth daily.        . hydrocortisone (ANUSOL-HC) 25 MG suppository       . hydrocortisone-pramoxine (ANALPRAM-HC) 2.5-1 % rectal cream Place 1 application rectally 4 (four) times daily. For irritated and painful hemorrhoids  15 g  2  . isosorbide mononitrate (IMDUR) 15 mg TB24 24 hr tablet Take 0.5 tablets (15 mg total) by mouth daily.  15 tablet  6  . lidocaine (XYLOCAINE) 5 % ointment Apply 1 application topically as needed.  35.44 g  0  . LYRICA 50 MG capsule       . Misc Natural Products (OSTEO BI-FLEX ADV DOUBLE ST) CAPS Take 2 capsules by mouth daily.        . Multiple Vitamins-Minerals (MULTIVITAMIN WITH MINERALS) tablet Take 1 tablet by mouth daily.        . nitroGLYCERIN (NITROSTAT) 0.4 MG SL tablet Place 1 tablet (0.4 mg total) under the tongue every 5 (five) minutes as needed for chest pain.  90 tablet  3  . olmesartan (BENICAR) 20 MG tablet Take 20 mg by mouth daily.        . Omega-3 Fatty Acids (FISH OIL) 1000 MG CAPS Take 1 capsule by mouth daily.          . pantoprazole (PROTONIX) 40 MG tablet Take 40 mg by mouth daily.        . potassium chloride SA (K-DUR,KLOR-CON) 20 MEQ tablet Take 20 mEq by mouth daily.        . simvastatin (ZOCOR) 40 MG tablet Take 40 mg by mouth at bedtime.        . traMADol (ULTRAM) 50 MG tablet Take 1 tablet (50 mg total) by mouth every 6 (six) hours as needed.  30 tablet  0  . warfarin (COUMADIN) 5 MG tablet Take 5 mg by mouth daily.         No current facility-administered medications on file prior to visit.    Allergies  Allergen Reactions  . Ivp Dye [Iodinated Diagnostic Agents] Hives    Past Medical History  Diagnosis Date  . CAD (coronary artery disease)   . Factor V Leiden   . GERD (gastroesophageal reflux disease)   . Multinodular goiter   . Internal hemorrhoids   . Hyperlipidemia   . Hypertension   . Nephrolithiasis   . Osteopenia   . Phlebitis   . PVD (peripheral vascular disease)   . Varicose vein   . Vitamin D deficiency   .  DDD (degenerative disc disease), lumbar   . Blood transfusion without reported diagnosis   . Clotting disorder     Past Surgical History  Procedure Laterality Date  . Total abdominal hysterectomy    . Bladder repair    . Coronary artery bypass graft    . Coronary artery bypass graft  2004    lima-lad,rima-rca,lradial-om1&2  . Breast biopsy      History  Smoking status  . Never Smoker   Smokeless tobacco  . Never Used    History  Alcohol Use  . Yes    Comment: occ    Family History  Problem Relation Age of Onset  . Coronary artery disease Mother   . Heart disease Mother   . Alzheimer's disease Sister   . Heart disease Brother   . Heart attack Brother   . Heart attack Brother     Review of Systems: As noted in HPI.  All other systems were reviewed and are negative.  Physical Exam: BP 136/54  Pulse 72  Ht 5\' 2"  (1.575 m)  Wt 136 lb (61.689 kg)  BMI 24.87 kg/m2 She is a pleasant elderly WF in NAD HEENT: [place]/AT, PERRL, EOMI. Sclera are  clear. Oropharynx is clear. Neck: no JVD, adenopathy, thyromegaly, or bruits. Lungs: clear CV: RRR, normal S1-2. Soft 1/6 SEM. No gallop Abdomen: soft, NT, BS positive. No masses, bruits, or HSM Ext. reduced right femoral and pedal pulses. Trace edema on the right. Superficial varicosities. Skin: warm and dry Neuro: alert and oriented x 3. CN II-XII intact.  LABORATORY DATA: Cardiology Nuclear Med Study  Heather Alvarado is a 80 y.o. female MRN : [id number] DOB: April 04, 1923  Procedure Date: 01/13/2014  Nuclear Med Background  Indication for Stress Test: Evaluation for Ischemia and Graft Patency  History: CABG 2004, CAD, MPI 2009 (normal) EF 77%  Cardiac Risk Factors: Family History - CAD, Hypertension, Lipids and PVD  Symptoms: Chest Pain and Dizziness  Nuclear Pre-Procedure  Caffeine/Decaff Intake: None  NPO After: 5:00am   Lungs: clear  O2 Sat: 97% on room air.  IV 0.9% NS with Angio Cath: 22g   IV Site: L Antecubital  IV Started by: Milana NaSabrina Williams, EMT-P   Chest Size (in): 38  Cup Size: B   Height: 5\' 2"  (1.575 m)  Weight: 129 lb (58.514 kg)   BMI: Body mass index is 23.59 kg/(m^2).  Tech Comments: Breakfast 5:00 am   Nuclear Med Study  1 or 2 day study: 1 day  Stress Test Type: Lexiscan   Reading MD: n/a  Order Authorizing Provider: P. [place]   Resting Radionuclide: Technetium 3017m Sestamibi  Resting Radionuclide Dose: 11.0 mCi   Stress Radionuclide: Technetium 1717m Sestamibi  Stress Radionuclide Dose: 33.0 mCi   Stress Protocol  Rest HR: 78  Stress HR: 104   Rest BP: 142/69  Stress BP: 138/48   Exercise Time (min): n/a  METS: n/a    Dose of Adenosine (mg): n/a  Dose of Lexiscan: 0.4 mg   Dose of Atropine (mg): n/a  Dose of Dobutamine: n/a mcg/kg/min (at max HR)   Stress Test Technologist: Nelson ChimesSharon Brooks, BS-ES  Nuclear Technologist: Harlow AsaElizabeth Young, CNMT   Rest Procedure: Myocardial perfusion imaging was performed at rest 45 minutes following the intravenous administration of  Technetium 1317m Sestamibi.  Rest ECG: NSR-RBBB  Stress Procedure: The patient received IV Lexiscan 0.4 mg over 15-seconds. Technetium 3217m Sestamibi injected at 30-seconds. Quantitative spect images were obtained after a 45 minute delay. During  the infusion of Lexiscan, the patient complained of feeling weird. She began to feel better in recovery.  Stress ECG: No significant ST segment change suggestive of ischemia.  QPS  Raw Data Images: Acquisition technically good; normal left ventricular size.  Stress Images: There is decreased uptake in the apex.  Rest Images: Normal homogeneous uptake in all areas of the myocardium.  Subtraction (SDS): These findings are consistent with ischemia.  Transient Ischemic Dilatation (Normal <1.22): 1.01  Lung/Heart Ratio (Normal <0.45): 0.42  Quantitative Gated Spect Images  QGS EDV: 45 ml  QGS ESV: 4 ml  Impression  Exercise Capacity: Lexiscan with no exercise.  BP Response: Normal blood pressure response.  Clinical Symptoms: No chest pain or dyspnea.  ECG Impression: No significant ST segment change suggestive of ischemia.  Comparison with Prior Nuclear Study: No previous nuclear study performed  Overall Impression: Low risk stress nuclear study with a small, mild intensity, reversible apical defect consistent with mild ischemia.  LV Ejection Fraction: 91%. LV Wall Motion: NL LV Function; NL Wall Motion  Kirk Ruths   LE arterial dopplers: normal.   Assessment / Plan: 1. Chest pain. Now resolved. I have prescribed sl Ntg and instructed in its use. She is 11 years out from CABG. Myoview study is essentially normal with very subtle apical defect. Continue risk factor modification. I will follow up in one year.  2. Leg pain. Normal dopplers. Encourage increase walking.  3. CAD s/p CABG (all arterial conduits) in 2004.  4. Factor V Leiden deficiency- on chronic coumadin  5. Hyperlipidemia  6. HTN  7. IVP dye allergy-hives.

## 2014-02-10 NOTE — Patient Instructions (Signed)
Increase your aerobic activity  Continue your current therapy  I will see you in one year.

## 2014-02-18 ENCOUNTER — Encounter (INDEPENDENT_AMBULATORY_CARE_PROVIDER_SITE_OTHER): Payer: Self-pay | Admitting: Surgery

## 2014-02-22 ENCOUNTER — Encounter (INDEPENDENT_AMBULATORY_CARE_PROVIDER_SITE_OTHER): Payer: Self-pay | Admitting: Surgery

## 2014-02-22 ENCOUNTER — Ambulatory Visit (INDEPENDENT_AMBULATORY_CARE_PROVIDER_SITE_OTHER): Payer: Medicare Other | Admitting: Surgery

## 2014-02-22 VITALS — BP 120/61 | HR 79 | Temp 97.7°F | Resp 14 | Ht 62.0 in | Wt 132.0 lb

## 2014-02-22 DIAGNOSIS — K644 Residual hemorrhoidal skin tags: Secondary | ICD-10-CM | POA: Diagnosis not present

## 2014-02-22 NOTE — Progress Notes (Signed)
Subjective:     Patient ID: Heather Alvarado, female   DOB: [birth year], 80 y.o.   MRN: [id number]  HPI She has no complaints today. She now has minimal perianal discomfort. She has had no bleeding in the past week.  Review of Systems     Objective:   Physical Exam On exam, the external excoriated hemorrhoid is much improved and is nontender today    Assessment:     Patient stable with an excoriated external hemorrhoid     Plan:     We will continue conservative management. If this area flares up again, we will just have to get her on a Lovenox bridge and consider hemorrhoidectomy

## 2014-02-23 DIAGNOSIS — E785 Hyperlipidemia, unspecified: Secondary | ICD-10-CM | POA: Diagnosis not present

## 2014-02-23 DIAGNOSIS — I2510 Atherosclerotic heart disease of native coronary artery without angina pectoris: Secondary | ICD-10-CM | POA: Diagnosis not present

## 2014-02-23 DIAGNOSIS — I10 Essential (primary) hypertension: Secondary | ICD-10-CM | POA: Diagnosis not present

## 2014-02-23 DIAGNOSIS — E042 Nontoxic multinodular goiter: Secondary | ICD-10-CM | POA: Diagnosis not present

## 2014-02-23 DIAGNOSIS — E079 Disorder of thyroid, unspecified: Secondary | ICD-10-CM | POA: Diagnosis not present

## 2014-02-23 DIAGNOSIS — Z7901 Long term (current) use of anticoagulants: Secondary | ICD-10-CM | POA: Diagnosis not present

## 2014-02-23 DIAGNOSIS — E559 Vitamin D deficiency, unspecified: Secondary | ICD-10-CM | POA: Diagnosis not present

## 2014-02-23 DIAGNOSIS — M810 Age-related osteoporosis without current pathological fracture: Secondary | ICD-10-CM | POA: Diagnosis not present

## 2014-03-02 DIAGNOSIS — Z23 Encounter for immunization: Secondary | ICD-10-CM | POA: Diagnosis not present

## 2014-03-02 DIAGNOSIS — Z7901 Long term (current) use of anticoagulants: Secondary | ICD-10-CM | POA: Diagnosis not present

## 2014-03-02 DIAGNOSIS — M810 Age-related osteoporosis without current pathological fracture: Secondary | ICD-10-CM | POA: Diagnosis not present

## 2014-03-02 DIAGNOSIS — E785 Hyperlipidemia, unspecified: Secondary | ICD-10-CM | POA: Diagnosis not present

## 2014-03-02 DIAGNOSIS — Z0000 Encounter for general adult medical examination without abnormal findings: Secondary | ICD-10-CM | POA: Diagnosis not present

## 2014-03-02 DIAGNOSIS — D682 Hereditary deficiency of other clotting factors: Secondary | ICD-10-CM | POA: Diagnosis not present

## 2014-03-02 DIAGNOSIS — B0229 Other postherpetic nervous system involvement: Secondary | ICD-10-CM | POA: Diagnosis not present

## 2014-03-02 DIAGNOSIS — I10 Essential (primary) hypertension: Secondary | ICD-10-CM | POA: Diagnosis not present

## 2014-03-16 DIAGNOSIS — Z7901 Long term (current) use of anticoagulants: Secondary | ICD-10-CM | POA: Diagnosis not present

## 2014-03-16 DIAGNOSIS — D682 Hereditary deficiency of other clotting factors: Secondary | ICD-10-CM | POA: Diagnosis not present

## 2014-04-21 DIAGNOSIS — D682 Hereditary deficiency of other clotting factors: Secondary | ICD-10-CM | POA: Diagnosis not present

## 2014-04-21 DIAGNOSIS — Z7901 Long term (current) use of anticoagulants: Secondary | ICD-10-CM | POA: Diagnosis not present

## 2014-06-02 DIAGNOSIS — Z7901 Long term (current) use of anticoagulants: Secondary | ICD-10-CM | POA: Diagnosis not present

## 2014-06-02 DIAGNOSIS — D682 Hereditary deficiency of other clotting factors: Secondary | ICD-10-CM | POA: Diagnosis not present

## 2014-06-15 DIAGNOSIS — I10 Essential (primary) hypertension: Secondary | ICD-10-CM | POA: Diagnosis not present

## 2014-06-15 DIAGNOSIS — M1990 Unspecified osteoarthritis, unspecified site: Secondary | ICD-10-CM | POA: Diagnosis not present

## 2014-06-15 DIAGNOSIS — M545 Low back pain, unspecified: Secondary | ICD-10-CM | POA: Diagnosis not present

## 2014-06-15 DIAGNOSIS — M25519 Pain in unspecified shoulder: Secondary | ICD-10-CM | POA: Diagnosis not present

## 2014-06-15 DIAGNOSIS — IMO0002 Reserved for concepts with insufficient information to code with codable children: Secondary | ICD-10-CM | POA: Diagnosis not present

## 2014-06-19 DIAGNOSIS — M25519 Pain in unspecified shoulder: Secondary | ICD-10-CM | POA: Diagnosis not present

## 2014-06-30 DIAGNOSIS — D682 Hereditary deficiency of other clotting factors: Secondary | ICD-10-CM | POA: Diagnosis not present

## 2014-06-30 DIAGNOSIS — Z7901 Long term (current) use of anticoagulants: Secondary | ICD-10-CM | POA: Diagnosis not present

## 2014-07-03 DIAGNOSIS — M25519 Pain in unspecified shoulder: Secondary | ICD-10-CM | POA: Diagnosis not present

## 2014-07-14 DIAGNOSIS — M25519 Pain in unspecified shoulder: Secondary | ICD-10-CM | POA: Diagnosis not present

## 2014-07-20 DIAGNOSIS — M25519 Pain in unspecified shoulder: Secondary | ICD-10-CM | POA: Diagnosis not present

## 2014-07-28 DIAGNOSIS — D682 Hereditary deficiency of other clotting factors: Secondary | ICD-10-CM | POA: Diagnosis not present

## 2014-07-28 DIAGNOSIS — Z7901 Long term (current) use of anticoagulants: Secondary | ICD-10-CM | POA: Diagnosis not present

## 2014-08-18 DIAGNOSIS — Z7901 Long term (current) use of anticoagulants: Secondary | ICD-10-CM | POA: Diagnosis not present

## 2014-08-18 DIAGNOSIS — D682 Hereditary deficiency of other clotting factors: Secondary | ICD-10-CM | POA: Diagnosis not present

## 2014-09-06 DIAGNOSIS — M545 Low back pain, unspecified: Secondary | ICD-10-CM | POA: Diagnosis not present

## 2014-09-06 DIAGNOSIS — I10 Essential (primary) hypertension: Secondary | ICD-10-CM | POA: Diagnosis not present

## 2014-09-06 DIAGNOSIS — E785 Hyperlipidemia, unspecified: Secondary | ICD-10-CM | POA: Diagnosis not present

## 2014-09-06 DIAGNOSIS — Z7901 Long term (current) use of anticoagulants: Secondary | ICD-10-CM | POA: Diagnosis not present

## 2014-09-06 DIAGNOSIS — I2510 Atherosclerotic heart disease of native coronary artery without angina pectoris: Secondary | ICD-10-CM | POA: Diagnosis not present

## 2014-09-06 DIAGNOSIS — E079 Disorder of thyroid, unspecified: Secondary | ICD-10-CM | POA: Diagnosis not present

## 2014-09-06 DIAGNOSIS — M25519 Pain in unspecified shoulder: Secondary | ICD-10-CM | POA: Diagnosis not present

## 2014-09-06 DIAGNOSIS — Z1331 Encounter for screening for depression: Secondary | ICD-10-CM | POA: Diagnosis not present

## 2014-09-06 DIAGNOSIS — M810 Age-related osteoporosis without current pathological fracture: Secondary | ICD-10-CM | POA: Diagnosis not present

## 2014-09-06 DIAGNOSIS — Z23 Encounter for immunization: Secondary | ICD-10-CM | POA: Diagnosis not present

## 2014-09-06 DIAGNOSIS — D682 Hereditary deficiency of other clotting factors: Secondary | ICD-10-CM | POA: Diagnosis not present

## 2014-09-27 DIAGNOSIS — D682 Hereditary deficiency of other clotting factors: Secondary | ICD-10-CM | POA: Diagnosis not present

## 2014-09-27 DIAGNOSIS — Z7901 Long term (current) use of anticoagulants: Secondary | ICD-10-CM | POA: Diagnosis not present

## 2014-10-10 DIAGNOSIS — I639 Cerebral infarction, unspecified: Secondary | ICD-10-CM

## 2014-10-10 HISTORY — DX: Cerebral infarction, unspecified: I63.9

## 2014-10-11 DIAGNOSIS — Z7901 Long term (current) use of anticoagulants: Secondary | ICD-10-CM | POA: Diagnosis not present

## 2014-10-11 DIAGNOSIS — D682 Hereditary deficiency of other clotting factors: Secondary | ICD-10-CM | POA: Diagnosis not present

## 2014-10-11 DIAGNOSIS — R42 Dizziness and giddiness: Secondary | ICD-10-CM | POA: Diagnosis not present

## 2014-10-19 DIAGNOSIS — D682 Hereditary deficiency of other clotting factors: Secondary | ICD-10-CM | POA: Diagnosis not present

## 2014-10-19 DIAGNOSIS — I2510 Atherosclerotic heart disease of native coronary artery without angina pectoris: Secondary | ICD-10-CM | POA: Diagnosis not present

## 2014-10-19 DIAGNOSIS — R42 Dizziness and giddiness: Secondary | ICD-10-CM | POA: Diagnosis not present

## 2014-10-19 DIAGNOSIS — Z7901 Long term (current) use of anticoagulants: Secondary | ICD-10-CM | POA: Diagnosis not present

## 2014-10-21 ENCOUNTER — Emergency Department (HOSPITAL_COMMUNITY): Payer: Medicare Other

## 2014-10-21 ENCOUNTER — Inpatient Hospital Stay (HOSPITAL_COMMUNITY)
Admission: EM | Admit: 2014-10-21 | Discharge: 2014-10-25 | DRG: 065 | Disposition: A | Discharge status: Skilled Nursing Facility | Payer: Medicare Other | Attending: Internal Medicine | Admitting: Internal Medicine

## 2014-10-21 ENCOUNTER — Encounter (HOSPITAL_COMMUNITY): Payer: Self-pay | Admitting: Emergency Medicine

## 2014-10-21 ENCOUNTER — Inpatient Hospital Stay (HOSPITAL_COMMUNITY): Payer: Medicare Other

## 2014-10-21 ENCOUNTER — Other Ambulatory Visit (HOSPITAL_COMMUNITY): Payer: Medicare Other

## 2014-10-21 DIAGNOSIS — I2510 Atherosclerotic heart disease of native coronary artery without angina pectoris: Secondary | ICD-10-CM | POA: Diagnosis present

## 2014-10-21 DIAGNOSIS — I63349 Cerebral infarction due to thrombosis of unspecified cerebellar artery: Secondary | ICD-10-CM | POA: Diagnosis not present

## 2014-10-21 DIAGNOSIS — I639 Cerebral infarction, unspecified: Principal | ICD-10-CM | POA: Diagnosis present

## 2014-10-21 DIAGNOSIS — M6281 Muscle weakness (generalized): Secondary | ICD-10-CM | POA: Diagnosis not present

## 2014-10-21 DIAGNOSIS — I059 Rheumatic mitral valve disease, unspecified: Secondary | ICD-10-CM | POA: Diagnosis not present

## 2014-10-21 DIAGNOSIS — I6521 Occlusion and stenosis of right carotid artery: Secondary | ICD-10-CM | POA: Diagnosis present

## 2014-10-21 DIAGNOSIS — I25119 Atherosclerotic heart disease of native coronary artery with unspecified angina pectoris: Secondary | ICD-10-CM

## 2014-10-21 DIAGNOSIS — I739 Peripheral vascular disease, unspecified: Secondary | ICD-10-CM | POA: Diagnosis present

## 2014-10-21 DIAGNOSIS — R531 Weakness: Secondary | ICD-10-CM | POA: Diagnosis not present

## 2014-10-21 DIAGNOSIS — I10 Essential (primary) hypertension: Secondary | ICD-10-CM | POA: Diagnosis present

## 2014-10-21 DIAGNOSIS — Z91041 Radiographic dye allergy status: Secondary | ICD-10-CM

## 2014-10-21 DIAGNOSIS — Z8249 Family history of ischemic heart disease and other diseases of the circulatory system: Secondary | ICD-10-CM

## 2014-10-21 DIAGNOSIS — Z66 Do not resuscitate: Secondary | ICD-10-CM | POA: Diagnosis present

## 2014-10-21 DIAGNOSIS — E785 Hyperlipidemia, unspecified: Secondary | ICD-10-CM | POA: Diagnosis present

## 2014-10-21 DIAGNOSIS — R41841 Cognitive communication deficit: Secondary | ICD-10-CM | POA: Diagnosis not present

## 2014-10-21 DIAGNOSIS — R112 Nausea with vomiting, unspecified: Secondary | ICD-10-CM

## 2014-10-21 DIAGNOSIS — M810 Age-related osteoporosis without current pathological fracture: Secondary | ICD-10-CM | POA: Diagnosis not present

## 2014-10-21 DIAGNOSIS — E559 Vitamin D deficiency, unspecified: Secondary | ICD-10-CM | POA: Diagnosis present

## 2014-10-21 DIAGNOSIS — R42 Dizziness and giddiness: Secondary | ICD-10-CM | POA: Diagnosis not present

## 2014-10-21 DIAGNOSIS — K219 Gastro-esophageal reflux disease without esophagitis: Secondary | ICD-10-CM | POA: Diagnosis present

## 2014-10-21 DIAGNOSIS — R262 Difficulty in walking, not elsewhere classified: Secondary | ICD-10-CM | POA: Diagnosis not present

## 2014-10-21 DIAGNOSIS — Z951 Presence of aortocoronary bypass graft: Secondary | ICD-10-CM | POA: Diagnosis not present

## 2014-10-21 DIAGNOSIS — N289 Disorder of kidney and ureter, unspecified: Secondary | ICD-10-CM | POA: Diagnosis present

## 2014-10-21 DIAGNOSIS — M8580 Other specified disorders of bone density and structure, unspecified site: Secondary | ICD-10-CM | POA: Diagnosis present

## 2014-10-21 DIAGNOSIS — E876 Hypokalemia: Secondary | ICD-10-CM | POA: Diagnosis not present

## 2014-10-21 DIAGNOSIS — M5136 Other intervertebral disc degeneration, lumbar region: Secondary | ICD-10-CM | POA: Diagnosis present

## 2014-10-21 DIAGNOSIS — R278 Other lack of coordination: Secondary | ICD-10-CM | POA: Diagnosis not present

## 2014-10-21 DIAGNOSIS — Z820 Family history of epilepsy and other diseases of the nervous system: Secondary | ICD-10-CM | POA: Diagnosis not present

## 2014-10-21 DIAGNOSIS — I69828 Other speech and language deficits following other cerebrovascular disease: Secondary | ICD-10-CM | POA: Diagnosis not present

## 2014-10-21 DIAGNOSIS — Z7901 Long term (current) use of anticoagulants: Secondary | ICD-10-CM

## 2014-10-21 DIAGNOSIS — D6851 Activated protein C resistance: Secondary | ICD-10-CM | POA: Diagnosis present

## 2014-10-21 HISTORY — DX: Cerebral infarction, unspecified: I63.9

## 2014-10-21 LAB — BASIC METABOLIC PANEL
ANION GAP: 15 (ref 5–15)
BUN: 28 mg/dL — AB (ref 6–23)
CO2: 24 meq/L (ref 19–32)
CREATININE: 1.33 mg/dL — AB (ref 0.50–1.10)
Calcium: 10.2 mg/dL (ref 8.4–10.5)
Chloride: 102 mEq/L (ref 96–112)
GFR calc Af Amer: 40 mL/min — ABNORMAL LOW (ref >90)
GFR calc non Af Amer: 34 mL/min — ABNORMAL LOW (ref >90)
Glucose, Bld: 150 mg/dL — ABNORMAL HIGH (ref 70–99)
Potassium: 4.2 mEq/L (ref 3.7–5.3)
Sodium: 141 mEq/L (ref 137–147)

## 2014-10-21 LAB — CBC WITH DIFFERENTIAL/PLATELET
BASOS PCT: 0 % (ref 0–1)
Basophils Absolute: 0 10*3/uL (ref 0.0–0.1)
Eosinophils Absolute: 0.1 10*3/uL (ref 0.0–0.7)
Eosinophils Relative: 1 % (ref 0–5)
HCT: 42.6 % (ref 36.0–46.0)
HEMOGLOBIN: 14.1 g/dL (ref 12.0–15.0)
LYMPHS PCT: 31 % (ref 12–46)
Lymphs Abs: 3.4 10*3/uL (ref 0.7–4.0)
MCH: 30.1 pg (ref 26.0–34.0)
MCHC: 33.1 g/dL (ref 30.0–36.0)
MCV: 90.8 fL (ref 78.0–100.0)
MONOS PCT: 9 % (ref 3–12)
Monocytes Absolute: 1 10*3/uL (ref 0.1–1.0)
NEUTROS PCT: 59 % (ref 43–77)
Neutro Abs: 6.6 10*3/uL (ref 1.7–7.7)
Platelets: 246 10*3/uL (ref 150–400)
RBC: 4.69 MIL/uL (ref 3.87–5.11)
RDW: 13.6 % (ref 11.5–15.5)
WBC: 11 10*3/uL — AB (ref 4.0–10.5)

## 2014-10-21 LAB — PROTIME-INR
INR: 2 — AB (ref 0.00–1.49)
Prothrombin Time: 22.9 seconds — ABNORMAL HIGH (ref 11.6–15.2)

## 2014-10-21 LAB — LIPASE, BLOOD: Lipase: 40 U/L (ref 11–59)

## 2014-10-21 LAB — I-STAT TROPONIN, ED: Troponin i, poc: 0 ng/mL (ref 0.00–0.08)

## 2014-10-21 MED ORDER — SODIUM CHLORIDE 0.9 % IV SOLN
INTRAVENOUS | Status: DC
  Administered 2014-10-21 – 2014-10-24 (×3): via INTRAVENOUS

## 2014-10-21 MED ORDER — STROKE: EARLY STAGES OF RECOVERY BOOK
Freq: Once | Status: AC
  Administered 2014-10-21: 18:00:00
  Filled 2014-10-21: qty 1

## 2014-10-21 MED ORDER — OMEGA-3-ACID ETHYL ESTERS 1 G PO CAPS
1.0000 g | ORAL_CAPSULE | Freq: Two times a day (BID) | ORAL | Status: DC
  Administered 2014-10-21 – 2014-10-25 (×8): 1 g via ORAL
  Filled 2014-10-21 (×8): qty 1

## 2014-10-21 MED ORDER — ASPIRIN EC 325 MG PO TBEC
325.0000 mg | DELAYED_RELEASE_TABLET | Freq: Once | ORAL | Status: AC
  Administered 2014-10-21: 325 mg via ORAL
  Filled 2014-10-21: qty 1

## 2014-10-21 MED ORDER — SIMVASTATIN 40 MG PO TABS
40.0000 mg | ORAL_TABLET | Freq: Every day | ORAL | Status: DC
  Administered 2014-10-21 – 2014-10-24 (×4): 40 mg via ORAL
  Filled 2014-10-21 (×4): qty 1

## 2014-10-21 MED ORDER — WARFARIN - PHARMACIST DOSING INPATIENT: Freq: Every day | Status: DC

## 2014-10-21 MED ORDER — HYDROCORTISONE ACE-PRAMOXINE 1-1 % RE FOAM
1.0000 | Freq: Four times a day (QID) | RECTAL | Status: DC
  Administered 2014-10-22 – 2014-10-24 (×3): 1 via RECTAL
  Filled 2014-10-21: qty 10

## 2014-10-21 MED ORDER — WARFARIN SODIUM 5 MG PO TABS
5.0000 mg | ORAL_TABLET | ORAL | Status: AC
  Administered 2014-10-21: 5 mg via ORAL
  Filled 2014-10-21: qty 1

## 2014-10-21 MED ORDER — WARFARIN SODIUM 5 MG PO TABS: 5.0000 mg | ORAL_TABLET | Freq: Once | ORAL | Status: DC

## 2014-10-21 MED ORDER — PANTOPRAZOLE SODIUM 40 MG PO TBEC
40.0000 mg | DELAYED_RELEASE_TABLET | Freq: Every day | ORAL | Status: DC
Start: 2014-10-21 — End: 2014-10-25
  Administered 2014-10-21 – 2014-10-25 (×5): 40 mg via ORAL
  Filled 2014-10-21 (×5): qty 1

## 2014-10-21 MED ORDER — NITROGLYCERIN 0.4 MG SL SUBL: 0.4000 mg | SUBLINGUAL_TABLET | SUBLINGUAL | Status: DC | PRN

## 2014-10-21 MED ORDER — SENNOSIDES-DOCUSATE SODIUM 8.6-50 MG PO TABS: 1.0000 | ORAL_TABLET | Freq: Every evening | ORAL | Status: DC | PRN

## 2014-10-21 NOTE — Progress Notes (Signed)
ANTICOAGULATION CONSULT NOTE - Initial Consult  Pharmacy Consult:  Coumadin Indication:  Factor V Leiden deficiency + Recent CVA  Allergies  Allergen Reactions  . Ivp Dye [Iodinated Diagnostic Agents] Hives    Patient Measurements: Height: 5\' 2"  (157.5 cm) Weight: 130 lb (58.968 kg) IBW/kg (Calculated) : 50.1  Vital Signs: Temp: 97.6 F (36.4 C) (11/12 1112) Temp Source: Oral (11/12 1112) BP: 158/59 mmHg (11/12 1700) Pulse Rate: 65 (11/12 1600)  Labs:  Recent Labs  10/21/14 1121  HGB 14.1  HCT 42.6  PLT 246  LABPROT 22.9*  INR 2.00*  CREATININE 1.33*    Estimated Creatinine Clearance: 22.7 mL/min (by C-G formula based on Cr of 1.33).   Medical History: Past Medical History  Diagnosis Date  . CAD (coronary artery disease)   . Factor V Leiden   . GERD (gastroesophageal reflux disease)   . Multinodular goiter   . Internal hemorrhoids   . Hyperlipidemia   . Hypertension   . Nephrolithiasis   . Osteopenia   . Phlebitis   . PVD (peripheral vascular disease)   . Varicose vein   . Vitamin D deficiency   . DDD (degenerative disc disease), lumbar   . Blood transfusion without reported diagnosis   . Clotting disorder       Assessment: 7 YOF on Coumadin from P[facility] for history of Factor V Leiden deficiency.  Patient presented with dizziness, nausea and vomiting for one week.  MRI showed large subacute infarct involving nearly the entire right cerebellum and Pharmacy consulted to continue Coumadin.  Spoke with Dr. Janann Colonel, will keep INR between 2-3 due to recent stroke, but patient may need a higher INR goal in the future.  Patient and family do not recall her home Coumadin regimen.  INR currently therapeutic at 2.   Goal of Therapy:  INR 2-3 for now    Plan:  - Coumadin 5mg  PO today - Daily PT / INR - F/U INR goal    Heather Alvarado D. Mina Marble, PharmD, BCPS Pager:  [phone] 10/21/2014, 6:00 PM

## 2014-10-21 NOTE — Consult Note (Addendum)
Referring Physician: Rogene Houston    Chief Complaint: Stroke  HPI:                                                                                                                                         Heather Alvarado is an 80 y.o. female on chronic coumadin for Factor V Leiden deficiency, who noted one week ago she was walking like she was drunk. She went to her INR clinic a week ago and was given medication for her dizziness and made a appointment to have a CT scan of her head today.  All week she felt she was off balance swaying to both sides to the point she obtained a cane for balance.  She went to her CT scan today and was told to go to ED due to a right cerebellar infarct. [name] is on coumadin and INR is 2.00.  Currenlty patient has no complaints other than feeling "drunk when she is walking".    Date last known well: 10/14/2014 Time last known well: 1200 tPA Given: No: out of window and INR 2.00  Past Medical History  Diagnosis Date  . CAD (coronary artery disease)   . Factor V Leiden   . GERD (gastroesophageal reflux disease)   . Multinodular goiter   . Internal hemorrhoids   . Hyperlipidemia   . Hypertension   . Nephrolithiasis   . Osteopenia   . Phlebitis   . PVD (peripheral vascular disease)   . Varicose vein   . Vitamin D deficiency   . DDD (degenerative disc disease), lumbar   . Blood transfusion without reported diagnosis   . Clotting disorder     Past Surgical History  Procedure Laterality Date  . Total abdominal hysterectomy    . Bladder repair    . Coronary artery bypass graft    . Coronary artery bypass graft  2004    lima-lad,rima-rca,lradial-om1&2  . Breast biopsy      Family History  Problem Relation Age of Onset  . Coronary artery disease Mother   . Heart disease Mother   . Alzheimer's disease Sister   . Heart disease Brother   . Heart attack Brother   . Heart attack Brother    Social History:  reports that she has never smoked. She has never  used smokeless tobacco. She reports that she drinks alcohol. She reports that she does not use illicit drugs.  Allergies:  Allergies  Allergen Reactions  . Ivp Dye [Iodinated Diagnostic Agents] Hives    Medications:  No current facility-administered medications for this encounter.   Current Outpatient Prescriptions  Medication Sig Dispense Refill  . calcium-vitamin D (OSCAL WITH D) 500-200 MG-UNIT per tablet Take 1 tablet by mouth.    . Cholecalciferol (VITAMIN D) 2000 UNITS CAPS Take 1 capsule by mouth daily.      . hydrochlorothiazide (HYDRODIURIL) 25 MG tablet Take 25 mg by mouth daily.      . hydrocortisone (ANUSOL-HC) 25 MG suppository     . hydrocortisone-pramoxine (ANALPRAM-HC) 2.5-1 % rectal cream Place 1 application rectally 4 (four) times daily. For irritated and painful hemorrhoids 15 g 2  . isosorbide mononitrate (IMDUR) 15 mg TB24 24 hr tablet Take 0.5 tablets (15 mg total) by mouth daily. 15 tablet 6  . lidocaine (XYLOCAINE) 5 % ointment Apply 1 application topically as needed. 35.44 g 0  . LYRICA 50 MG capsule     . Misc Natural Products (OSTEO BI-FLEX ADV DOUBLE ST) CAPS Take 2 capsules by mouth daily.      . Multiple Vitamins-Minerals (MULTIVITAMIN WITH MINERALS) tablet Take 1 tablet by mouth daily.      . nitroGLYCERIN (NITROSTAT) 0.4 MG SL tablet Place 1 tablet (0.4 mg total) under the tongue every 5 (five) minutes as needed for chest pain. 90 tablet 3  . olmesartan (BENICAR) 20 MG tablet Take 20 mg by mouth daily.      . Omega-3 Fatty Acids (FISH OIL) 1000 MG CAPS Take 1 capsule by mouth daily.      . pantoprazole (PROTONIX) 40 MG tablet Take 40 mg by mouth daily.      . potassium chloride SA (K-DUR,KLOR-CON) 20 MEQ tablet Take 20 mEq by mouth daily.      . simvastatin (ZOCOR) 40 MG tablet Take 40 mg by mouth at bedtime.      . traMADol (ULTRAM) 50  MG tablet Take 1 tablet (50 mg total) by mouth every 6 (six) hours as needed. 30 tablet 0  . warfarin (COUMADIN) 5 MG tablet Take 5 mg by mouth daily.         ROS:                                                                                                                                       History obtained from the patient  General ROS: negative for - chills, fatigue, fever, night sweats, weight gain or weight loss Psychological ROS: negative for - behavioral disorder, hallucinations, memory difficulties, mood swings or suicidal ideation Ophthalmic ROS: negative for - blurry vision, double vision, eye pain or loss of vision ENT ROS: negative for - epistaxis, nasal discharge, oral lesions, sore throat, tinnitus or vertigo Allergy and Immunology ROS: negative for - hives or itchy/watery eyes Hematological and Lymphatic ROS: negative for - bleeding problems, bruising or swollen lymph nodes Endocrine ROS: negative for - galactorrhea, hair pattern changes, polydipsia/polyuria or temperature intolerance Respiratory ROS: negative for -  cough, hemoptysis, shortness of breath or wheezing Cardiovascular ROS: negative for - chest pain, dyspnea on exertion, edema or irregular heartbeat Gastrointestinal ROS: negative for - abdominal pain, diarrhea, hematemesis, nausea/vomiting or stool incontinence Genito-Urinary ROS: negative for - dysuria, hematuria, incontinence or urinary frequency/urgency Musculoskeletal ROS: negative for - joint swelling or muscular weakness Neurological ROS: as noted in HPI Dermatological ROS: negative for rash and skin lesion changes  Neurologic Examination:                                                                                                      Blood pressure 132/57, pulse 62, temperature 97.6 F (36.4 C), temperature source Oral, resp. rate 22, height 5\' 2"  (1.575 m), weight 58.968 kg (130 lb), SpO2 97 %.   General: NAD Mental Status: Alert, oriented,  thought content appropriate.  Speech fluent without evidence of aphasia.  Able to follow 3 step commands without difficulty. Cranial Nerves: II: Discs flat bilaterally; Visual fields grossly normal, pupils equal, round, reactive to light and accommodation III,IV, VI: ptosis not present, extra-ocular motions intact bilaterally V,VII: smile asymmetric on the right, facial light touch sensation normal bilaterally VIII: hearing normal bilaterally IX,X: gag reflex present XI: bilateral shoulder shrug XII: midline tongue extension without atrophy or fasciculations  Motor: Right : Upper extremity   5/5    Left:     Upper extremity   5/5  Lower extremity   5/5     Lower extremity   5/5 Tone and bulk:normal tone throughout; no atrophy noted Sensory: Pinprick and light touch intact throughout, bilaterally Deep Tendon Reflexes:  Right: Upper Extremity   Left: Upper extremity   biceps (C-5 to C-6) 2/4   biceps (C-5 to C-6) 2/4 tricep (C7) 2/4    triceps (C7) 2/4 Brachioradialis (C6) 2/4  Brachioradialis (C6) 2/4  Lower Extremity Lower Extremity  quadriceps (L-2 to L-4) 2/4   quadriceps (L-2 to L-4) 2/4 Achilles (S1) 0/4   Achilles (S1) 0/4  Plantars: Right: downgoing   Left: downgoing Cerebellar: normal finger-to-nose,  normal heel-to-shin test Gait: unsteady and wide based.  CV: pulses palpable throughout    Lab Results: Basic Metabolic Panel:  Recent Labs Lab 10/21/14 1121  NA 141  K 4.2  CL 102  CO2 24  GLUCOSE 150*  BUN 28*  CREATININE 1.33*  CALCIUM 10.2    Liver Function Tests: No results for input(s): AST, ALT, ALKPHOS, BILITOT, PROT, ALBUMIN in the last 168 hours.  Recent Labs Lab 10/21/14 1121  LIPASE 40   No results for input(s): AMMONIA in the last 168 hours.  CBC:  Recent Labs Lab 10/21/14 1121  WBC 11.0*  NEUTROABS 6.6  HGB 14.1  HCT 42.6  MCV 90.8  PLT 246    Cardiac Enzymes: No results for input(s): CKTOTAL, CKMB, CKMBINDEX, TROPONINI in  the last 168 hours.  Lipid Panel: No results for input(s): CHOL, TRIG, HDL, CHOLHDL, VLDL, LDLCALC in the last 168 hours.  CBG: No results for input(s): GLUCAP in the last 168 hours.  Microbiology: No results found for this or any  previous visit.  Coagulation Studies:  Recent Labs  10/21/14 1121  LABPROT 22.9*  INR 2.00*    Imaging: Dg Chest 2 View  10/21/2014   CLINICAL DATA:  Weakness and dizziness  EXAM: CHEST  2 VIEW  COMPARISON:  02/08/2011  FINDINGS: Cardiac shadow is stable. Postoperative changes are again seen. The right hemidiaphragm is elevated. No focal infiltrate or sizable effusion is seen. Aortic calcifications are noted. No acute bony abnormality is seen  IMPRESSION: No acute abnormality noted.   Electronically Signed   By: Inez Catalina M.D.   On: 10/21/2014 13:31   Ct Head Wo Contrast  10/21/2014   CLINICAL DATA:  Dizziness, nausea and vomiting.  EXAM: CT HEAD WITHOUT CONTRAST  TECHNIQUE: Contiguous axial images were obtained from the base of the skull through the vertex without intravenous contrast.  COMPARISON:  None.  FINDINGS: Large infarct noted of the right cerebellar hemisphere which involves a small portion of the vermis. Based on density, this is not very acute and likely greater than 53 hr old. No associated overt hemorrhage or significant mass effect. The rest the brain shows advanced small vessel ischemic changes and cortical atrophy. No hydrocephalus identified. The skull is unremarkable.  IMPRESSION: Subacute infarct involving nearly the entire right cerebellar hemisphere and a small portion of the cerebellar vermis. This infarct is not associated with visible acute hemorrhage or significant mass effect.  Critical Value/emergent results were called by telephone at the time of interpretation on 10/21/2014 at 1:16 pm to Dr. Franchot Mimes , who verbally acknowledged these results.   Electronically Signed   By: Aletta Edouard M.D.   On: 10/21/2014 13:38     Assessment and plan discussed with with attending physician and they are in agreement.    Etta Quill PA-C [facility] [phone]  10/21/2014, 2:52 PM   Assessment: 80 y.o. female with Factor V Leiden deficiency presents with one week history of gait instability. Found to have a sub-acute right cerebellar infarct on head CT, no edema or mass effect noted. INR currently 2  1)Cerebellar infarct 2)Factor V Leiden deficiency 3)Hypertension 4)CAD  Stroke Risk Factors - hyperlipidemia, hypertension and CAD  1. HgbA1c, fasting lipid panel 2. MRI, MRA  of the brain without contrast 3. Frequent neuro checks 4. Echocardiogram 5. Carotid dopplers 6. Prophylactic therapy-one week out from symptom onset would continue coumadin at this time. As on coumadin no indication for antiplatelet therapy at this time 7. Risk factor modification 8. Telemetry monitoring 9. PT consult, OT consult, Speech consult    Jim Like, DO [facility] [phone]  If 7pm- 7am, please page neurology on call as listed in [place].

## 2014-10-21 NOTE — ED Notes (Signed)
Pt sent here from PCP office for dizziness with N/V x 1 week; pt sts was having test in office for same and then sent here for further eval

## 2014-10-21 NOTE — Plan of Care (Signed)
Problem: Acute Treatment Outcomes Goal: Neuro exam at baseline or improved Outcome: Completed/Met Date Met:  10/21/14 Goal: BP within ordered parameters Outcome: Completed/Met Date Met:  10/21/14 Goal: Airway maintained/protected Outcome: Completed/Met Date Met:  10/21/14 Goal: 02 Sats > 94% Outcome: Completed/Met Date Met:  10/21/14 Goal: Hemodynamically stable Outcome: Completed/Met Date Met:  10/21/14 Goal: tPA Patient w/o S&S of bleeding Outcome: Not Applicable Date Met:  12/37/99 Goal: Prognosis discussed with family/patient as appropriate Outcome: Completed/Met Date Met:  10/21/14 Goal: Other Acute Treatment Outcomes Outcome: Completed/Met Date Met:  10/21/14

## 2014-10-21 NOTE — ED Provider Notes (Signed)
I saw and evaluated the patient, reviewed the resident's note and I agree with the findings and plan.   EKG Interpretation None      Results for orders placed or performed during the hospital encounter of 19/37/90  Basic metabolic panel  Result Value Ref Range   Sodium 141 137 - 147 mEq/L   Potassium 4.2 3.7 - 5.3 mEq/L   Chloride 102 96 - 112 mEq/L   CO2 24 19 - 32 mEq/L   Glucose, Bld 150 (H) 70 - 99 mg/dL   BUN 28 (H) 6 - 23 mg/dL   Creatinine, Ser 1.33 (H) 0.50 - 1.10 mg/dL   Calcium 10.2 8.4 - 10.5 mg/dL   GFR calc non Af Amer 34 (L) >90 mL/min   GFR calc Af Amer 40 (L) >90 mL/min   Anion gap 15 5 - 15  Protime-INR (if pt is taking Coumadin)  Result Value Ref Range   Prothrombin Time 22.9 (H) 11.6 - 15.2 seconds   INR 2.00 (H) 0.00 - 1.49  CBC with Differential  Result Value Ref Range   WBC 11.0 (H) 4.0 - 10.5 K/uL   RBC 4.69 3.87 - 5.11 MIL/uL   Hemoglobin 14.1 12.0 - 15.0 g/dL   HCT 42.6 36.0 - 46.0 %   MCV 90.8 78.0 - 100.0 fL   MCH 30.1 26.0 - 34.0 pg   MCHC 33.1 30.0 - 36.0 g/dL   RDW 13.6 11.5 - 15.5 %   Platelets 246 150 - 400 K/uL   Neutrophils Relative % 59 43 - 77 %   Neutro Abs 6.6 1.7 - 7.7 K/uL   Lymphocytes Relative 31 12 - 46 %   Lymphs Abs 3.4 0.7 - 4.0 K/uL   Monocytes Relative 9 3 - 12 %   Monocytes Absolute 1.0 0.1 - 1.0 K/uL   Eosinophils Relative 1 0 - 5 %   Eosinophils Absolute 0.1 0.0 - 0.7 K/uL   Basophils Relative 0 0 - 1 %   Basophils Absolute 0.0 0.0 - 0.1 K/uL  Lipase, blood  Result Value Ref Range   Lipase 40 11 - 59 U/L  I-stat troponin, ED (not at [facility])  Result Value Ref Range   Troponin i, poc 0.00 0.00 - 0.08 ng/mL   Comment 3           Dg Chest 2 View  10/21/2014   CLINICAL DATA:  Weakness and dizziness  EXAM: CHEST  2 VIEW  COMPARISON:  02/08/2011  FINDINGS: Cardiac shadow is stable. Postoperative changes are again seen. The right hemidiaphragm is elevated. No focal infiltrate or sizable effusion is seen. Aortic  calcifications are noted. No acute bony abnormality is seen  IMPRESSION: No acute abnormality noted.   Electronically Signed   By: Inez Catalina M.D.   On: 10/21/2014 13:31   Ct Head Wo Contrast  10/21/2014   CLINICAL DATA:  Dizziness, nausea and vomiting.  EXAM: CT HEAD WITHOUT CONTRAST  TECHNIQUE: Contiguous axial images were obtained from the base of the skull through the vertex without intravenous contrast.  COMPARISON:  None.  FINDINGS: Large infarct noted of the right cerebellar hemisphere which involves a small portion of the vermis. Based on density, this is not very acute and likely greater than 11 hr old. No associated overt hemorrhage or significant mass effect. The rest the brain shows advanced small vessel ischemic changes and cortical atrophy. No hydrocephalus identified. The skull is unremarkable.  IMPRESSION: Subacute infarct involving nearly the entire right cerebellar  hemisphere and a small portion of the cerebellar vermis. This infarct is not associated with visible acute hemorrhage or significant mass effect.  Critical Value/emergent results were called by telephone at the time of interpretation on 10/21/2014 at 1:16 pm to Dr. Franchot Mimes , who verbally acknowledged these results.   Electronically Signed   By: Aletta Edouard M.D.   On: 10/21/2014 13:38    Patient sent here from PCP off this for dizziness fairly significant dizziness sometimes vertigo. Nausea vomiting for the past week. Patient asked he says symptoms were started 10 days ago. Patient supposedly had like a carotid ultrasound done at [place]. In referred in here. Symptoms were concerning for possible CVA. CT scan shows a subacute infarct in the cerebellum area. Fairly large. Have consult at the neural hospitalist. Suspect there going to recommend the internal medicine admission which would be through Dr. Virgina Jock her primary care doctor.  Patient has significant dizziness some vertigo difficulty walking. Will  require admission.  Fredia Sorrow, MD 10/21/14 1537

## 2014-10-21 NOTE — H&P (Signed)
Patient Demographics  Heather Alvarado, is a 80 y.o. female  MRN: [id number]   DOB - Jan 06, 1923  Admit Date - 10/21/2014  Outpatient Primary MD for the patient is Precious Reel, MD   With History of -  Past Medical History  Diagnosis Date  . CAD (coronary artery disease)   . Factor V Leiden   . GERD (gastroesophageal reflux disease)   . Multinodular goiter   . Internal hemorrhoids   . Hyperlipidemia   . Hypertension   . Nephrolithiasis   . Osteopenia   . Phlebitis   . PVD (peripheral vascular disease)   . Varicose vein   . Vitamin D deficiency   . DDD (degenerative disc disease), lumbar   . Blood transfusion without reported diagnosis   . Clotting disorder   . Stroke 10/2014      Past Surgical History  Procedure Laterality Date  . Total abdominal hysterectomy    . Bladder repair    . Coronary artery bypass graft    . Coronary artery bypass graft  2004    lima-lad,rima-rca,lradial-om1&2  . Breast biopsy      in for   Chief Complaint  Patient presents with  . Dizziness  . Nausea     HPI  Heather Alvarado  is a 80 y.o. female, with past medical history factor V Leyden deficiency, coronary artery disease, hyperlipidemia, hypertension, presents with complaints of feeling wobbly, unsteady gait, she describes it as feeling drunk, reports she saw her PCP, who scheduled her for CT head and carotid Doppler, she underwent a head CT today and was told to go to ED acute right cerebellar infarct, patient is on warfarin where her INR is 2, he was given 325 mg of aspirin in ED. She reports her symptoms has been going on for a week.    Review of Systems    In addition to the HPI above,  No Fever-chills, No Headache, No changes with Vision or hearing, No problems swallowing food or Liquids, No Chest pain, Cough or Shortness of Breath, No Abdominal pain, No Nausea or Vommitting, Bowel movements are regular, No Blood in stool or Urine, No dysuria, No new skin rashes or  bruises, No new joints pains-aches,  Reports unsteady gait, dizziness, feeling wobbly, but denies any significant focal deficits No recent weight gain or loss, No polyuria, polydypsia or polyphagia, No significant Mental Stressors.  A full 10 point Review of Systems was done, except as stated above, all other Review of Systems were negative.   Social History History  Substance Use Topics  . Smoking status: Never Smoker   . Smokeless tobacco: Never Used  . Alcohol Use: Yes     Comment: occ     Family History Family History  Problem Relation Age of Onset  . Coronary artery disease Mother   . Heart disease Mother   . Alzheimer's disease Sister   . Heart disease Brother   . Heart attack Brother   . Heart attack Brother      Prior to Admission medications   Medication Sig Start Date End Date Taking? Authorizing Provider  calcium-vitamin D (OSCAL WITH D) 500-200 MG-UNIT per tablet Take 1 tablet by mouth.    Historical Provider, MD  Cholecalciferol (VITAMIN D) 2000 UNITS CAPS Take 1 capsule by mouth daily.      Historical Provider, MD  hydrochlorothiazide (HYDRODIURIL) 25 MG tablet Take 25 mg by mouth daily.      Historical Provider, MD  hydrocortisone (  ANUSOL-HC) 25 MG suppository  01/11/14   Historical Provider, MD  hydrocortisone-pramoxine [facility]) 2.5-1 % rectal cream Place 1 application rectally 4 (four) times daily. For irritated and painful hemorrhoids 02/01/14   Coralie Keens, MD  isosorbide mononitrate (IMDUR) 15 mg TB24 24 hr tablet Take 0.5 tablets (15 mg total) by mouth daily. 01/14/14   Peter M Martinique, MD  lidocaine (XYLOCAINE) 5 % ointment Apply 1 application topically as needed. 02/01/14   Coralie Keens, MD  LYRICA 50 MG capsule  01/11/14   Historical Provider, MD  Misc Natural Products (OSTEO BI-FLEX ADV DOUBLE ST) CAPS Take 2 capsules by mouth daily.      Historical Provider, MD  Multiple Vitamins-Minerals (MULTIVITAMIN WITH MINERALS) tablet Take 1 tablet by  mouth daily.      Historical Provider, MD  nitroGLYCERIN (NITROSTAT) 0.4 MG SL tablet Place 1 tablet (0.4 mg total) under the tongue every 5 (five) minutes as needed for chest pain. 01/12/14   Peter M Martinique, MD  olmesartan (BENICAR) 20 MG tablet Take 20 mg by mouth daily.      Historical Provider, MD  Omega-3 Fatty Acids (FISH OIL) 1000 MG CAPS Take 1 capsule by mouth daily.      Historical Provider, MD  pantoprazole (PROTONIX) 40 MG tablet Take 40 mg by mouth daily.      Historical Provider, MD  potassium chloride SA (K-DUR,KLOR-CON) 20 MEQ tablet Take 20 mEq by mouth daily.      Historical Provider, MD  simvastatin (ZOCOR) 40 MG tablet Take 40 mg by mouth at bedtime.      Historical Provider, MD  traMADol (ULTRAM) 50 MG tablet Take 1 tablet (50 mg total) by mouth every 6 (six) hours as needed. 02/01/14   Coralie Keens, MD  warfarin (COUMADIN) 5 MG tablet Take 5 mg by mouth daily.      Historical Provider, MD    Allergies  Allergen Reactions  . Ivp Dye [Iodinated Diagnostic Agents] Hives    Physical Exam  Vitals  Blood pressure 158/59, pulse 65, temperature 97.6 F (36.4 C), temperature source Oral, resp. rate 16, height 5\' 2"  (1.575 m), weight 58.968 kg (130 lb), SpO2 98 %.   1. General pleasant elderly female lying in bed in NAD,    2. Normal affect and insight, Not Suicidal or Homicidal, Awake Alert, Oriented X 3.  3. No F.N deficits, ALL C.Nerves Intact, Strength 5/5 all 4 extremities, Sensation intact all 4 extremities, Plantars down going.  4. Ears and Eyes appear Normal, Conjunctivae clear, PERRLA. Moist Oral Mucosa.  5. Supple Neck, No JVD, No cervical lymphadenopathy appriciated, No Carotid Bruits.  6. Symmetrical Chest wall movement, Good air movement bilaterally, CTAB.  7. RRR, No Gallops, Rubs or Murmurs, No Parasternal Heave.  8. Positive Bowel Sounds, Abdomen Soft, No tenderness, No organomegaly appriciated,No rebound -guarding or rigidity.  9.  No Cyanosis,  Normal Skin Turgor, No Skin Rash or Bruise.  10. Good muscle tone,  joints appear normal , no effusions, Normal ROM.  11. No Palpable Lymph Nodes in Neck or Axillae    Data Review  CBC  Recent Labs Lab 10/21/14 1121  WBC 11.0*  HGB 14.1  HCT 42.6  PLT 246  MCV 90.8  MCH 30.1  MCHC 33.1  RDW 13.6  LYMPHSABS 3.4  MONOABS 1.0  EOSABS 0.1  BASOSABS 0.0   ------------------------------------------------------------------------------------------------------------------  Chemistries   Recent Labs Lab 10/21/14 1121  NA 141  K 4.2  CL 102  CO2 24  GLUCOSE 150*  BUN 28*  CREATININE 1.33*  CALCIUM 10.2   ------------------------------------------------------------------------------------------------------------------ estimated creatinine clearance is 22.7 mL/min (by C-G formula based on Cr of 1.33). ------------------------------------------------------------------------------------------------------------------ No results for input(s): TSH, T4TOTAL, T3FREE, THYROIDAB in the last 72 hours.  Invalid input(s): FREET3   Coagulation profile  Recent Labs Lab 10/21/14 1121  INR 2.00*   ------------------------------------------------------------------------------------------------------------------- No results for input(s): DDIMER in the last 72 hours. -------------------------------------------------------------------------------------------------------------------  Cardiac Enzymes No results for input(s): CKMB, TROPONINI, MYOGLOBIN in the last 168 hours.  Invalid input(s): CK ------------------------------------------------------------------------------------------------------------------ Invalid input(s): POCBNP   ---------------------------------------------------------------------------------------------------------------  Urinalysis No results found for: COLORURINE, APPEARANCEUR, LABSPEC, PHURINE, GLUCOSEU, HGBUR, BILIRUBINUR, KETONESUR, PROTEINUR,  UROBILINOGEN, NITRITE, LEUKOCYTESUR  ----------------------------------------------------------------------------------------------------------------  Imaging results:   Dg Chest 2 View  10/21/2014   CLINICAL DATA:  Weakness and dizziness  EXAM: CHEST  2 VIEW  COMPARISON:  02/08/2011  FINDINGS: Cardiac shadow is stable. Postoperative changes are again seen. The right hemidiaphragm is elevated. No focal infiltrate or sizable effusion is seen. Aortic calcifications are noted. No acute bony abnormality is seen  IMPRESSION: No acute abnormality noted.   Electronically Signed   By: Inez Catalina M.D.   On: 10/21/2014 13:31   Ct Head Wo Contrast  10/21/2014   CLINICAL DATA:  Dizziness, nausea and vomiting.  EXAM: CT HEAD WITHOUT CONTRAST  TECHNIQUE: Contiguous axial images were obtained from the base of the skull through the vertex without intravenous contrast.  COMPARISON:  None.  FINDINGS: Large infarct noted of the right cerebellar hemisphere which involves a small portion of the vermis. Based on density, this is not very acute and likely greater than 69 hr old. No associated overt hemorrhage or significant mass effect. The rest the brain shows advanced small vessel ischemic changes and cortical atrophy. No hydrocephalus identified. The skull is unremarkable.  IMPRESSION: Subacute infarct involving nearly the entire right cerebellar hemisphere and a small portion of the cerebellar vermis. This infarct is not associated with visible acute hemorrhage or significant mass effect.  Critical Value/emergent results were called by telephone at the time of interpretation on 10/21/2014 at 1:16 pm to Dr. Franchot Mimes , who verbally acknowledged these results.   Electronically Signed   By: Aletta Edouard M.D.   On: 10/21/2014 13:38    My personal review of EKG: Rhythm NSR, Rate 61  /min, QTc 442 , no Acute ST changes    Assessment & Plan  Active Problems:   CAD (coronary artery disease)   Factor V Leiden    Hyperlipidemia   Hypertension   CVA (cerebral infarction)  CVA -Cerebral infarct, as evident on CT head, consult appreciated, will monitor on telemetry, check hemoglobin A1c, fasting lipid panel, MRI MRA of the brain without contrast, frequently checks, echocardiogram, carotid Dopplers, patient is on warfarin, so there is no further indication for antiplatelet therapy at this time, PT/OT/speech consult.  Hypertension -We will hold medication to allow for permissive hypertension  Hyperlipidemia -Opinion with fish oil and statin  Factor V Leyden deficiency -Consult pharmacy to dose warfarin  Coronary artery disease -Denies any chest pain or shortness of breath -on prn SL nitro.     DVT Prophylaxis on warfarin  AM Labs Ordered, also please review Full Orders  Family Communication: Admission, patients condition and plan of care including tests being ordered have been discussed with the patient and son who indicate understanding and agree with the plan and Code Status.  Code Status DO NOT RESUSCITATE  Disposition admit to telemetry  Condition  GUARDED    Time spent in minutes :60 minutes    Danta Baumgardner M.D on 10/21/2014 at 6:07 PM  Between 7am to 7pm - Pager - [phone]  After 7pm go to [email or web address] - password [email or web address]  And look for the night coverage person covering me after hours  [facility] Office  [phone]   **Disclaimer: This note may have been dictated with voice recognition software. Similar sounding words can inadvertently be transcribed and this note may contain transcription errors which may not have been corrected upon publication of note.**

## 2014-10-21 NOTE — ED Provider Notes (Signed)
CSN: [id number]     Arrival date & time 10/21/14  1101 History   First MD Initiated Contact with Patient 10/21/14 1140     Chief Complaint  Patient presents with  . Dizziness  . Nausea   HPI Comments: 80 y.o. female with a history of CAD, HLD, HTN, CAD, PVD, Factor V Leiden presents to the ED due to 10 days of dizziness which she describes as room spinning and lightheadedness at times. Associated with nausea and vomiting. She was at her PCP's office this morning and was sent over for nausea/vomiting after having imaging done of her carotid arteries, per her report. She also reports left sided headache. Reports feeling like she is falling to the right when walking.   Patient is a 80 y.o. female presenting with dizziness. The history is provided by the patient.  Dizziness Quality:  Head spinning, room spinning and lightheadedness Severity:  Severe Onset quality:  Sudden Duration:  10 days Timing:  Constant Progression:  Unchanged Chronicity:  New Context: eye movement and head movement   Context: not with ear pain and not with loss of consciousness   Relieved by:  None tried Worsened by:  Nothing tried Ineffective treatments:  None tried Associated symptoms: headaches, nausea and vomiting   Associated symptoms: no chest pain, no palpitations, no shortness of breath and no vision changes   Vomiting:    Quality:  Stomach contents   Severity:  Moderate   Timing:  Intermittent   Progression:  Unchanged Risk factors: no hx of stroke   Risk factors comment:  Hx of Factor V Leiden   Past Medical History  Diagnosis Date  . CAD (coronary artery disease)   . Factor V Leiden   . GERD (gastroesophageal reflux disease)   . Multinodular goiter   . Internal hemorrhoids   . Hyperlipidemia   . Hypertension   . Nephrolithiasis   . Osteopenia   . Phlebitis   . PVD (peripheral vascular disease)   . Varicose vein   . Vitamin D deficiency   . DDD (degenerative disc disease), lumbar   .  Blood transfusion without reported diagnosis   . Clotting disorder    Past Surgical History  Procedure Laterality Date  . Total abdominal hysterectomy    . Bladder repair    . Coronary artery bypass graft    . Coronary artery bypass graft  2004    lima-lad,rima-rca,lradial-om1&2  . Breast biopsy     Family History  Problem Relation Age of Onset  . Coronary artery disease Mother   . Heart disease Mother   . Alzheimer's disease Sister   . Heart disease Brother   . Heart attack Brother   . Heart attack Brother    History  Substance Use Topics  . Smoking status: Never Smoker   . Smokeless tobacco: Never Used  . Alcohol Use: Yes     Comment: occ   OB History    No data available     Review of Systems  Constitutional: Negative for fever and chills.  Respiratory: Negative for shortness of breath.   Cardiovascular: Negative for chest pain and palpitations.  Gastrointestinal: Positive for nausea, vomiting and abdominal pain (mild).  Musculoskeletal: Positive for gait problem.  Skin: Negative for rash.  Neurological: Positive for dizziness and headaches. Negative for seizures, facial asymmetry, speech difficulty and weakness.  All other systems reviewed and are negative.     Allergies  Ivp dye  Home Medications   Prior to Admission  medications   Medication Sig Start Date End Date Taking? Authorizing Provider  calcium-vitamin D (OSCAL WITH D) 500-200 MG-UNIT per tablet Take 1 tablet by mouth.    Historical Provider, MD  Cholecalciferol (VITAMIN D) 2000 UNITS CAPS Take 1 capsule by mouth daily.      Historical Provider, MD  hydrochlorothiazide (HYDRODIURIL) 25 MG tablet Take 25 mg by mouth daily.      Historical Provider, MD  hydrocortisone (ANUSOL-HC) 25 MG suppository  01/11/14   Historical Provider, MD  hydrocortisone-pramoxine [facility]) 2.5-1 % rectal cream Place 1 application rectally 4 (four) times daily. For irritated and painful hemorrhoids 02/01/14   Coralie Keens, MD  isosorbide mononitrate (IMDUR) 15 mg TB24 24 hr tablet Take 0.5 tablets (15 mg total) by mouth daily. 01/14/14   Peter M Martinique, MD  lidocaine (XYLOCAINE) 5 % ointment Apply 1 application topically as needed. 02/01/14   Coralie Keens, MD  LYRICA 50 MG capsule  01/11/14   Historical Provider, MD  Misc Natural Products (OSTEO BI-FLEX ADV DOUBLE ST) CAPS Take 2 capsules by mouth daily.      Historical Provider, MD  Multiple Vitamins-Minerals (MULTIVITAMIN WITH MINERALS) tablet Take 1 tablet by mouth daily.      Historical Provider, MD  nitroGLYCERIN (NITROSTAT) 0.4 MG SL tablet Place 1 tablet (0.4 mg total) under the tongue every 5 (five) minutes as needed for chest pain. 01/12/14   Peter M Martinique, MD  olmesartan (BENICAR) 20 MG tablet Take 20 mg by mouth daily.      Historical Provider, MD  Omega-3 Fatty Acids (FISH OIL) 1000 MG CAPS Take 1 capsule by mouth daily.      Historical Provider, MD  pantoprazole (PROTONIX) 40 MG tablet Take 40 mg by mouth daily.      Historical Provider, MD  potassium chloride SA (K-DUR,KLOR-CON) 20 MEQ tablet Take 20 mEq by mouth daily.      Historical Provider, MD  simvastatin (ZOCOR) 40 MG tablet Take 40 mg by mouth at bedtime.      Historical Provider, MD  traMADol (ULTRAM) 50 MG tablet Take 1 tablet (50 mg total) by mouth every 6 (six) hours as needed. 02/01/14   Coralie Keens, MD  warfarin (COUMADIN) 5 MG tablet Take 5 mg by mouth daily.      Historical Provider, MD   BP 131/55 mmHg  Pulse 60  Temp(Src) 97.6 F (36.4 C) (Oral)  Resp 18  Ht 5\' 2"  (1.575 m)  Wt 130 lb (58.968 kg)  BMI 23.77 kg/m2  SpO2 96%   Physical Exam  Constitutional: She is oriented to person, place, and time. She appears well-developed and well-nourished. She is cooperative. No distress.  HENT:  Head: Normocephalic and atraumatic.  Right Ear: External ear normal.  Left Ear: External ear normal.  Eyes: Pupils are equal, round, and reactive to light.  Able to look in all  directions with EOM testing, difficulty with following in a smooth gaze -- would stop and look at me frequently  Neck: Normal range of motion and phonation normal.  Cardiovascular: Normal rate and regular rhythm.   Pulmonary/Chest: Effort normal and breath sounds normal. No respiratory distress. She has no wheezes. She has no rales.  Abdominal: Soft. She exhibits no distension. There is no tenderness. There is no rebound and no guarding.  Neurological: She is alert and oriented to person, place, and time.  Pronator drift on right; Normal sensation to light touch in V1-V3, Face symmetric with eyebrow raise, smile, clenching  jaw and squeezing eyes shut; normal strength with shoulder shrug, normal strength in all major muscle groups of upper and lower extremities; normal finger to nose; difficulty following directions with EOM testing; Pupils equal, round and reactive to light  Skin: Skin is warm and dry. No rash noted. She is not diaphoretic.  Psychiatric: She has a normal mood and affect.    ED Course  Procedures  Labs Review  Results for orders placed or performed during the hospital encounter of 40/98/11  Basic metabolic panel  Result Value Ref Range   Sodium 141 137 - 147 mEq/L   Potassium 4.2 3.7 - 5.3 mEq/L   Chloride 102 96 - 112 mEq/L   CO2 24 19 - 32 mEq/L   Glucose, Bld 150 (H) 70 - 99 mg/dL   BUN 28 (H) 6 - 23 mg/dL   Creatinine, Ser 1.33 (H) 0.50 - 1.10 mg/dL   Calcium 10.2 8.4 - 10.5 mg/dL   GFR calc non Af Amer 34 (L) >90 mL/min   GFR calc Af Amer 40 (L) >90 mL/min   Anion gap 15 5 - 15  Protime-INR (if pt is taking Coumadin)  Result Value Ref Range   Prothrombin Time 22.9 (H) 11.6 - 15.2 seconds   INR 2.00 (H) 0.00 - 1.49  CBC with Differential  Result Value Ref Range   WBC 11.0 (H) 4.0 - 10.5 K/uL   RBC 4.69 3.87 - 5.11 MIL/uL   Hemoglobin 14.1 12.0 - 15.0 g/dL   HCT 42.6 36.0 - 46.0 %   MCV 90.8 78.0 - 100.0 fL   MCH 30.1 26.0 - 34.0 pg   MCHC 33.1 30.0 - 36.0  g/dL   RDW 13.6 11.5 - 15.5 %   Platelets 246 150 - 400 K/uL   Neutrophils Relative % 59 43 - 77 %   Neutro Abs 6.6 1.7 - 7.7 K/uL   Lymphocytes Relative 31 12 - 46 %   Lymphs Abs 3.4 0.7 - 4.0 K/uL   Monocytes Relative 9 3 - 12 %   Monocytes Absolute 1.0 0.1 - 1.0 K/uL   Eosinophils Relative 1 0 - 5 %   Eosinophils Absolute 0.1 0.0 - 0.7 K/uL   Basophils Relative 0 0 - 1 %   Basophils Absolute 0.0 0.0 - 0.1 K/uL  Lipase, blood  Result Value Ref Range   Lipase 40 11 - 59 U/L  I-stat troponin, ED (not at [facility])  Result Value Ref Range   Troponin i, poc 0.00 0.00 - 0.08 ng/mL   Comment 3            Imaging Review Dg Chest 2 View  10/21/2014   CLINICAL DATA:  Weakness and dizziness  EXAM: CHEST  2 VIEW  COMPARISON:  02/08/2011  FINDINGS: Cardiac shadow is stable. Postoperative changes are again seen. The right hemidiaphragm is elevated. No focal infiltrate or sizable effusion is seen. Aortic calcifications are noted. No acute bony abnormality is seen  IMPRESSION: No acute abnormality noted.   Electronically Signed   By: Inez Catalina M.D.   On: 10/21/2014 13:31   Ct Head Wo Contrast  10/21/2014   CLINICAL DATA:  Dizziness, nausea and vomiting.  EXAM: CT HEAD WITHOUT CONTRAST  TECHNIQUE: Contiguous axial images were obtained from the base of the skull through the vertex without intravenous contrast.  COMPARISON:  None.  FINDINGS: Large infarct noted of the right cerebellar hemisphere which involves a small portion of the vermis. Based on density, this is  not very acute and likely greater than 21 hr old. No associated overt hemorrhage or significant mass effect. The rest the brain shows advanced small vessel ischemic changes and cortical atrophy. No hydrocephalus identified. The skull is unremarkable.  IMPRESSION: Subacute infarct involving nearly the entire right cerebellar hemisphere and a small portion of the cerebellar vermis. This infarct is not associated with visible acute hemorrhage or  significant mass effect.  Critical Value/emergent results were called by telephone at the time of interpretation on 10/21/2014 at 1:16 pm to Dr. Franchot Mimes , who verbally acknowledged these results.   Electronically Signed   By: Aletta Edouard M.D.   On: 10/21/2014 13:38    EKG: NSR, Rate 61, Incomplete RBBB, T wave inversion anteriorly appears unchanged from prior  MDM   Final diagnoses:  Dizziness  Nausea & vomiting    80 y.o. female with a history of CAD, HLD, HTN, CAD, PVD, Factor V Leiden presents to the ED due to 10 days of dizziness which she describes as room spinning and lightheadedness at times. Associated with nausea and vomiting. She was at her PCP's office this morning and was sent over for nausea/vomiting after having imaging done of her carotid arteries, per her report. She also reports left sided headache.   Exam as above. Deferred walking her due to her report of falling to the right with nausea and vomiting. Concern for CVA, vertebral artery dissection.   Troponin negative. Creatinine at baseline. INR 2.00. Lipase and CBC unremarkable.   CT head obtained which showed large subacute infarct involving nearly the entire right cerebellum. Neurology consulted. They recommended admission to medicine.  [facility] was consulted for admission. Recommended giving 325mg  aspirin which was ordered. She was admitted to telemetry.   This case managed in conjunction with my attending, Dr. Rogene Houston.     Berenice Primas, MD 10/21/14 [phone]

## 2014-10-22 DIAGNOSIS — R42 Dizziness and giddiness: Secondary | ICD-10-CM

## 2014-10-22 DIAGNOSIS — I059 Rheumatic mitral valve disease, unspecified: Secondary | ICD-10-CM

## 2014-10-22 LAB — LIPID PANEL
Cholesterol: 129 mg/dL (ref 0–200)
HDL: 63 mg/dL (ref >39)
LDL Cholesterol: 50 mg/dL (ref 0–99)
Total CHOL/HDL Ratio: 2 RATIO
Triglycerides: 81 mg/dL (ref <150)
VLDL: 16 mg/dL (ref 0–40)

## 2014-10-22 LAB — PROTIME-INR
INR: 2.24 — ABNORMAL HIGH (ref 0.00–1.49)
Prothrombin Time: 25 seconds — ABNORMAL HIGH (ref 11.6–15.2)

## 2014-10-22 LAB — HEMOGLOBIN A1C
HEMOGLOBIN A1C: 6.1 % — AB (ref <5.7)
MEAN PLASMA GLUCOSE: 128 mg/dL — AB (ref <117)

## 2014-10-22 MED ORDER — WARFARIN SODIUM 5 MG PO TABS
5.0000 mg | ORAL_TABLET | Freq: Once | ORAL | Status: AC
  Administered 2014-10-22: 5 mg via ORAL
  Filled 2014-10-22: qty 1

## 2014-10-22 MED ORDER — ACETAMINOPHEN 325 MG PO TABS
650.0000 mg | ORAL_TABLET | Freq: Four times a day (QID) | ORAL | Status: DC | PRN
  Administered 2014-10-22 – 2014-10-23 (×2): 650 mg via ORAL
  Filled 2014-10-22 (×2): qty 2

## 2014-10-22 NOTE — Progress Notes (Signed)
VASCULAR LAB PRELIMINARY  PRELIMINARY  PRELIMINARY  PRELIMINARY  Carotid duplex  completed.    Preliminary report:  Right:  40-59% internal carotid artery stenosis (high end of the range).  Left:  1-39% ICA stenosis.  Bilateral:  Vertebral artery flow is antegrade.     Brynlei Klausner, RVT 10/22/2014, 12:54 PM

## 2014-10-22 NOTE — Progress Notes (Signed)
ANTICOAGULATION CONSULT NOTE - Follow Up Consult  Pharmacy Consult:  Coumadin Indication:  Factor V Leiden deficiency + Recent CVA  Allergies  Allergen Reactions  . Ivp Dye [Iodinated Diagnostic Agents] Hives    Patient Measurements: Height: 5\' 2"  (157.5 cm) Weight: 125 lb 4.8 oz (56.836 kg) IBW/kg (Calculated) : 50.1  Vital Signs: Temp: 98.1 F (36.7 C) (11/13 0801) Temp Source: Oral (11/13 0801) BP: 156/69 mmHg (11/13 0801) Pulse Rate: 77 (11/13 0801)  Labs:  Recent Labs  10/21/14 1121 10/22/14 0542  HGB 14.1  --   HCT 42.6  --   PLT 246  --   LABPROT 22.9* 25.0*  INR 2.00* 2.24*  CREATININE 1.33*  --     Estimated Creatinine Clearance: 22.7 mL/min (by C-G formula based on Cr of 1.33).   Medical History: Past Medical History  Diagnosis Date  . CAD (coronary artery disease)   . Factor V Leiden   . GERD (gastroesophageal reflux disease)   . Multinodular goiter   . Internal hemorrhoids   . Hyperlipidemia   . Hypertension   . Nephrolithiasis   . Osteopenia   . Phlebitis   . PVD (peripheral vascular disease)   . Varicose vein   . Vitamin D deficiency   . DDD (degenerative disc disease), lumbar   . Blood transfusion without reported diagnosis   . Clotting disorder   . Stroke 10/2014      Assessment: 80 yo F on Coumadin PTA for history of Factor V Leiden deficiency.  Patient presented with dizziness, nausea and vomiting for one week.  MRI showed large subacute infarct involving nearly the entire right cerebellum and Pharmacy consulted to continue Coumadin.    INR currently therapeutic at 2.24   Goal of Therapy:  INR 2-3     Plan:  - Coumadin 5mg  PO today - Daily PT / INR  [employer], Pharm.D., BCPS Clinical Pharmacist Pager [phone] 10/22/2014 10:45 AM

## 2014-10-22 NOTE — Progress Notes (Addendum)
STROKE TEAM PROGRESS NOTE   HISTORY Heather Alvarado is an 80 y.o. female on chronic coumadin for Factor V Leiden deficiency, who noted one week ago she was walking like she was drunk. She went to her INR clinic a week ago and was given medication for her dizziness and made a appointment to have a CT scan of her head today. All week she felt she was off balance swaying to both sides to the point she obtained a cane for balance. She went to her CT scan today and was told to go to ED due to a right cerebellar infarct. [name] is on coumadin and INR is 2.00. Currenlty patient has no complaints other than feeling "drunk when she is walking".   Date last known well: 10/14/2014 Time last known well: 1200 tPA Given: No: out of window and INR 2.00   SUBJECTIVE (INTERVAL HISTORY) The patient's son and daughter-in-law are at the bedside. The patient's son reported that he believed the INR has always been greater than 2.0. Records from Dr. Keane Police office indicate an INR 1.7 and 1.9, however, it was not clear when this occurred, but 11/2 seems 2.7.  Overall the patient is feeling much better and is essentially back to baseline but has a headache a left frontal area. She denies prior history of stroke. She can not tell what symptoms leading her to check for factor V laiden mutation but she has been on coumadin for a long time. No clot has been known to her.   OBJECTIVE Temp:  [97.9 F (36.6 C)-98.4 F (36.9 C)] 98.1 F (36.7 C) (11/13 1138) Pulse Rate:  [65-81] 81 (11/13 1138) Cardiac Rhythm:  [-] Normal sinus rhythm;Bundle branch block (11/13 0759) Resp:  [14-26] 26 (11/13 1138) BP: (120-161)/(45-69) 161/54 mmHg (11/13 1138) SpO2:  [93 %-98 %] 95 % (11/13 1138) Weight:  [125 lb 4.8 oz (56.836 kg)] 125 lb 4.8 oz (56.836 kg) (11/13 0530)  No results for input(s): GLUCAP in the last 168 hours.  Recent Labs Lab 10/21/14 1121  NA 141  K 4.2  CL 102  CO2 24  GLUCOSE 150*  BUN 28*  CREATININE  1.33*  CALCIUM 10.2   No results for input(s): AST, ALT, ALKPHOS, BILITOT, PROT, ALBUMIN in the last 168 hours.  Recent Labs Lab 10/21/14 1121  WBC 11.0*  NEUTROABS 6.6  HGB 14.1  HCT 42.6  MCV 90.8  PLT 246   No results for input(s): CKTOTAL, CKMB, CKMBINDEX, TROPONINI in the last 168 hours.  Recent Labs  10/21/14 1121 10/22/14 0542  LABPROT 22.9* 25.0*  INR 2.00* 2.24*   No results for input(s): COLORURINE, LABSPEC, PHURINE, GLUCOSEU, HGBUR, BILIRUBINUR, KETONESUR, PROTEINUR, UROBILINOGEN, NITRITE, LEUKOCYTESUR in the last 72 hours.  Invalid input(s): APPERANCEUR     Component Value Date/Time   CHOL 129 10/22/2014 0542   TRIG 81 10/22/2014 0542   HDL 63 10/22/2014 0542   CHOLHDL 2.0 10/22/2014 0542   VLDL 16 10/22/2014 0542   LDLCALC 50 10/22/2014 0542   No results found for: HGBA1C No results found for: LABOPIA, COCAINSCRNUR, LABBENZ, AMPHETMU, THCU, LABBARB  No results for input(s): ETH in the last 168 hours.  Dg Chest 2 View 10/21/2014    No acute abnormality noted.    Ct Head Wo Contrast 10/21/2014    Subacute infarct involving nearly the entire right cerebellar hemisphere and a small portion of the cerebellar vermis. This infarct is not associated with visible acute hemorrhage or significant mass effect.  10/22/2014    MRI HEAD:  Acute to subacute RIGHT posterior inferior cerebellar artery territory infarct.  Moderate to severe global parenchymal brain volume loss. Moderate white matter changes suggest chronic small vessel ischemic disease.  Solitary LEFT occipital lobe micro hemorrhage, nonspecific.   MRA HEAD: No acute vascular process nor hemodynamically significant stenosis.     CUS - Right: 40-59% internal carotid artery stenosis (high end of the range). Left: 1-39% ICA stenosis. Bilateral: Vertebral artery flow is antegrade.  2D echo - - Left ventricle: The cavity size was normal. Wall thickness was normal. Systolic function was normal.  The estimated ejection fraction was in the range of 60% to 65%. Doppler parameters are consistent with abnormal left ventricular relaxation (grade 1 diastolic dysfunction). - Aortic valve: There was trivial regurgitation. - Mitral valve: There was mild regurgitation.  PHYSICAL EXAM Physical exam  Temp:  [97.9 F (36.6 C)-98.4 F (36.9 C)] 98.1 F (36.7 C) (11/13 1138) Pulse Rate:  [65-81] 81 (11/13 1138) Resp:  [14-26] 26 (11/13 1138) BP: (120-161)/(45-69) 161/54 mmHg (11/13 1138) SpO2:  [93 %-98 %] 95 % (11/13 1138) Weight:  [125 lb 4.8 oz (56.836 kg)] 125 lb 4.8 oz (56.836 kg) (11/13 0530)  General - Well nourished, well developed, in no apparent distress.  Ophthalmologic - not able to see through.  Cardiovascular - Regular rate and rhythm with no murmur.  Mental Status -  Level of arousal and orientation to time, place, and person were intact. Language including expression, naming, repetition, comprehension was assessed and found intact.  Cranial Nerves II - XII - II - Visual field intact OU. III, IV, VI - Extraocular movements intact. V - Facial sensation intact bilaterally. VII - Facial movement intact bilaterally. VIII - Hearing & vestibular intact bilaterally. X - Palate elevates symmetrically. XI - Chin turning & shoulder shrug intact bilaterally. XII - Tongue protrusion intact.  Motor Strength - The patient's strength was normal in all extremities and pronator drift was absent.  Bulk was normal and fasciculations were absent.   Motor Tone - Muscle tone was assessed at the neck and appendages and was normal.  Reflexes - The patient's reflexes were1+ in all extremities and she had no pathological reflexes.  Sensory - Light touch, temperature/pinprick were assessed and were normal.    Coordination - The patient had normal movements in the hands and feet with no ataxia or dysmetria.  Tremor was absent.  Gait and Station - deferred.   ASSESSMENT/PLAN Ms.  Heather Alvarado is a 80 y.o. female with history of Factor V Leiden deficiency on coumadin, CAD, HLD, HTN, PVD presenting with balance difficulties for a week. She did not receive IV t-PA due to late presentation and warfarin therapy. CT showed right cerebellar stroke which was confirmed on MRI. INR admission 2.0 and today 2.24.   Stroke:  Non-dominant cerebellar infarct likely due to small vessel disease as Factor V Leiden deficiency normally not cause arterial infarct.  Resultant  Resolution of deficits  MRI  Acute to subacute right PICA territory infarct  MRA  No acute vascular process nor hemodynamically significant stenosis.     Carotid Doppler - Preliminary report: Right: 40-59% internal carotid artery stenosis (high end of the range). Left: 1-39% ICA stenosis. Bilateral: Vertebral artery flow is antegrade.- no intervention needed.  2D Echo - unremarkable  LDL 50, on the goal < 70  HgbA1c 6.1, on the goal < 6.5  Warfarin for VTE prophylaxis  Diet Heart with thin liquids  warfarin prior to admission, now on warfarin  Ongoing aggressive risk factor management  Factor V laiden deficiency  On coumadin, currently INR therapeutic  INR goal 2-3  No evidence of venous clot at this time  Almost exclusive for tendency of venous clot but not arterial clot  Hypertension  Home meds: hydrochlorothiazide 25 mg daily and Benicar 20 mg daily.  Stable  Permissive hypertension <220/120 for 24-48 hours and then gradually normalize within 5-7 days  Hyperlipidemia  Home meds:  Zocor 40 mg daily resumed in hospital  LDL 50, goal < 70  Continue statin at discharge  Other Stroke Risk Factors  Advanced age  Hx stroke/TIA  Coronary artery disease  Other Active Problems  Renal insufficiency - INR 2.24 today  Right internal carotid artery stenosis   Other Pertinent History    Hospital day # 1  Mikey Bussing PA-C [facility] Pager [phone] 10/22/2014, 3:13 PM  I, the attending vascular neurologist, have personally obtained a history, examined the patient, evaluated laboratory data, individually viewed imaging studies. Together with the NP/PA, we formulated the assessment and plan of care which reflects our mutual decision.  I have made any additions or clarifications directly to the above note and agree with the findings and plan as currently documented.   Please note: All text in blue color in the note is my addition to the original note.   Neurology will sign off. Please call with questions. Pt will follow up with Dr. Erlinda Hong at [facility] in about 2 months. Thanks for the consult.  Rosalin Hawking, MD PhD Stroke Neurology 10/22/2014 3:24 PM  To contact Stroke Continuity provider, please refer to [email or web address] After hours, contact General Neurology

## 2014-10-22 NOTE — Evaluation (Signed)
Occupational Therapy Evaluation Patient Details Name: Heather Alvarado MRN: [id number] DOB: 10-10-1923 Today's Date: 10/22/2014    History of Present Illness This 80 y.o. female admitted with one week h/o feeling wobbly and unsteady gait.  MRI of head reaveals acute/subacute Rt. posterior inferior cerebellar artery territory infarct.  CT of head showed subacute infarct of nearly the entire right cerebellar hemisphere.  PMH:  CAD; HTN   Clinical Impression   Pt admitted with above. She demonstrates the below listed deficits and will benefit from continued OT to maximize safety and independence with BADLs.  She presents to OT with dysmetria Lt. UE; impaired balance; impaired visual pursuits.  She requires min A, overall for BADLs and functional mobility.  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  Recommend SNF level rehab.       Follow Up Recommendations  SNF    Equipment Recommendations  None recommended by OT    Recommendations for Other Services       Precautions / Restrictions Precautions Precautions: Fall Restrictions Weight Bearing Restrictions: No      Mobility Bed Mobility Overal bed mobility: Needs Assistance Bed Mobility: Supine to Sit;Sit to Supine     Supine to sit: Supervision Sit to supine: Supervision   General bed mobility comments: Pt able to sit up from supine with HOB highly elevated. Pt able to adjust position once sitting EOB.   Transfers Overall transfer level: Needs assistance Equipment used: Rolling walker (2 wheeled) Transfers: Sit to/from [facility] Sit to Stand: Min assist Stand pivot transfers: Min assist       General transfer comment: min A for balance     Balance Overall balance assessment: Needs assistance Sitting-balance support: Feet supported Sitting balance-Leahy Scale: Good     Standing balance support: During functional activity Standing balance-Leahy Scale: Fair                               ADL Overall ADL's : Needs assistance/impaired Eating/Feeding: Modified independent;Sitting   Grooming: Wash/dry hands;Wash/dry face;Oral care;Brushing hair;Minimal assistance;Standing   Upper Body Bathing: Set up;Sitting   Lower Body Bathing: Minimal assistance;Sit to/from stand   Upper Body Dressing : Supervision/safety;Sitting   Lower Body Dressing: Minimal assistance;Sit to/from stand   Toilet Transfer: Minimal assistance;Ambulation;Comfort height toilet;RW   Toileting- Clothing Manipulation and Hygiene: Minimal assistance;Sit to/from stand       Functional mobility during ADLs: Min guard;Minimal assistance;Rolling walker General ADL Comments: Pt requires min A for balance.      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  She also reports "poor vision" Rt. eye that has been long standing.   She loses object in Lt visual fields when tracking to Lt, but fields appear intact with confrontation testing.  Pt denies dizziness today, but reports she has had dizziness with postural changes while in hospital    Perception     [place] tested?: Within functional limits    Pertinent Vitals/Pain Pain Assessment: 0-10 Pain Score: 5  Pain Location: Lt frontal headache Pain Descriptors / Indicators: Aching Pain Intervention(s): Limited activity within patient's tolerance;Monitored during session;Patient requesting pain meds-RN notified     Hand Dominance Right   Extremity/Trunk Assessment Upper Extremity Assessment Upper Extremity Assessment: LUE deficits/detail  LUE Deficits / Details: strength grossly 4+/5; pt with dysmetria  LUE Sensation: decreased proprioception LUE Coordination: decreased fine motor;decreased gross motor   Lower Extremity Assessment Lower  Extremity Assessment: Defer to PT evaluation   Cervical / Trunk Assessment Cervical / Trunk Assessment: Normal   Communication Communication Communication: No difficulties   Cognition Arousal/Alertness: Awake/alert Behavior During Therapy: WFL for tasks assessed/performed Overall Cognitive Status: Within Functional Limits for tasks assessed                     General Comments       Exercises       Shoulder Instructions      Home Living Family/patient expects to be discharged to:: Skilled nursing facility Living Arrangements: Spouse/significant other   Type of Home: House Home Access: Ramped entrance     Home Layout: Two level;Able to live on main level with bedroom/bathroom               Home Equipment: Kasandra Knudsen - single point   Additional Comments: Pt lives at home with husband who is wheelchair bound. Pt reports having an aid who comes in everyday for assistance but there is no one to help in the evenings. Pt reported using a SPC a few days prior to admission but otherwise did not use an AD for ambulation and was a furniture/wall walker.       Prior Functioning/Environment Level of Independence: Independent        Comments: Pt reports she continues to drive short distances     OT Diagnosis: Generalized weakness;Disturbance of vision;Ataxia   OT Problem List: Decreased strength;Decreased activity tolerance;Impaired balance (sitting and/or standing);Impaired vision/perception;Decreased coordination;Decreased knowledge of use of DME or AE;Impaired UE functional use   OT Treatment/Interventions: Self-care/ADL training;Neuromuscular education;DME and/or AE instruction;Therapeutic activities;Visual/perceptual remediation/compensation;Patient/family education;Balance training    OT Goals(Current goals can be found in the care plan section) Acute Rehab OT Goals Patient Stated Goal: Return home OT Goal Formulation: With patient Time For Goal Achievement:  11/05/14 Potential to Achieve Goals: Good ADL Goals Pt Will Perform Grooming: with supervision;standing Pt Will Perform Lower Body Bathing: with supervision;sit to/from stand Pt Will Perform Lower Body Dressing: with supervision;sit to/from stand Pt Will Transfer to Toilet: with supervision;ambulating;grab bars;regular height toilet  OT Frequency: Min 2X/week   Barriers to D/C: Decreased caregiver support          Co-evaluation              End of Session Nurse Communication: Patient requests pain meds  Activity Tolerance: Patient tolerated treatment well Patient left: in bed;with call bell/phone within reach;with family/visitor present   Time: 4081-4481 OT Time Calculation (min): 30 min Charges:  OT General Charges $OT Visit: 1 Procedure OT Evaluation $Initial OT Evaluation Tier I: 1 Procedure OT Treatments $Self Care/Home Management : 8-22 mins $Therapeutic Activity: 8-22 mins G-Codes:    Aurea Aronov M 2014/11/04, 5:35 PM

## 2014-10-22 NOTE — Progress Notes (Signed)
SLP Cancellation Note  Patient Details Name: Heather Alvarado MRN: [id number] DOB: 19-Jan-1923   Cancelled treatment:       Reason Eval/Treat Not Completed: SLP screened, pt good historian; speech and cognition at baseline per dtr.  No needs identified, will sign off.     Juan Quam Laurice 10/22/2014, 3:40 PM

## 2014-10-22 NOTE — Progress Notes (Signed)
  Echocardiogram 2D Echocardiogram has been performed.  Heather Alvarado [name] 10/22/2014, 1:01 PM

## 2014-10-22 NOTE — Progress Notes (Signed)
72 F C H/O CAD/4v CABG 2004, Factor V Leiden, GERD, Hyperlipidemia, HTN, Osteopenia, PAD sent to ED yesterday c N/V/Dizzy while completing a carotid US.  CCT in ED showed Subacute CVA.  MRI showed Acute to subacute RIGHT posterior inferior cerebellar artery territory infarct.  Moderate to severe global parenchymal brain volume loss. Moderate white matter changes suggest chronic small vessel ischemic disease. Solitary LEFT occipital lobe micro hemorrhage, nonspecific.  MRA HEAD: No acute vascular process nor hemodynamically significant Stenosis.   ECHO and repeat carotids P.  BP has been well controlled as outpt and decent here.  She is in NSR.  INR for Factor V Leiden has been therapeutic - INR 2.24 today.  LDL is 50.  Stress c Dr [place] 01/2014.  Appreciate NeuroHospitalist admit and care.  Needs PT/OT and time.  May need rehab services.  Seen and discussed c pt this am.  She is having trouble c balance and walking and dizzy.  PE is benign while she is lying in bed.

## 2014-10-22 NOTE — Progress Notes (Signed)
Utilization review completed. Laqueta Bonaventura, RN, BSN. 

## 2014-10-22 NOTE — Progress Notes (Signed)
PROGRESS NOTE  CHARLET HARR BBC:[id number] DOB: 1923/07/29 DOA: 10/21/2014 PCP: Precious Reel, MD  HPI/Subjective: 80 yo female presents with complaints of feeling wobbly and having unsteady gait x 1 week.  She describes it as feeling drunk.  PCP ordered head CT and carotid doppler study.  Pt is on Warfarin for factor V Leyden deficiency with an INR of 2.  PMH of CAD, hyperlipidemia, and hypertension.    Pt is well appearing without any other complaints other than unsteadiness.    Assessment/Plan:  CVA (cerebral infarction) Infarct likely occurred 1 week ago with onset of symptoms. MRI of brain shows acute to subacute RIGHT posterior inferior cerebellar artery territory infarct.  Head CT shows subacute infarct involving nearly the entire right cerebellar hemisphere and a small portion of the cerebellar vermis. This infarct is not associated with visible acute hemorrhage or significant mass effect.  INR now 2.24.  2D Echo, carotid doppler, and carotid duplex ordered.  A1C pending.  Ordering PT, OT, and Speech consult.     Will try to achieve INR between 2.5 and 3.0.  Factor V Leiden Chronic problem.  Managed on Coumadin.  INR at admission was 2.0.  Will continue to monitor INR.  Will try to achieve INR at higher end of 2.0-3.0 range.   CAD (coronary artery disease) Fasting lipid panel WNL.  Carotid doppler and duplex ordered.   Hyperlipidemia Chronic problem.  Well controlled.  Fasting lipid panel WNL.  Continue Simvastatin and Fish oil.    Hypertension Chronic problem.  Last BP 161/54.  Continue Imdur, HCTZ, and Olmesartan.        DVT Prophylaxis:  SCDs  Code Status: DNR Family Communication: Pt is awake and alert.  Disposition Plan: Will discharge pt home when medically appropriate.   Consultants:  Neurology  Procedures:  2D Echo  Carotid doppler  Carotid duplex  Antibiotics:  None  Objective: Filed Vitals:   10/22/14 0021 10/22/14 0239 10/22/14 0530  10/22/14 0801  BP: 137/47 129/45 120/55 156/69  Pulse: 75  78 77  Temp: 97.9 F (36.6 C) 98.4 F (36.9 C) 98 F (36.7 C) 98.1 F (36.7 C)  TempSrc: Oral Oral Oral Oral  Resp: 23 26 24 21   Height:      Weight:   56.836 kg (125 lb 4.8 oz)   SpO2: 95% 93% 95% 95%    Intake/Output Summary (Last 24 hours) at 10/22/14 1136 Last data filed at 10/22/14 0600  Gross per 24 hour  Intake 836.67 ml  Output      0 ml  Net 836.67 ml   Filed Weights   10/21/14 1112 10/22/14 0530  Weight: 58.968 kg (130 lb) 56.836 kg (125 lb 4.8 oz)    Exam: General: Well developed, well nourished, NAD, appears younger than stated age  52:  EOMI, Anicteic Sclera, MMM.  Cardiovascular: RRR, S1 S2 auscultated, no rubs, murmurs or gallops.   Respiratory: Clear to auscultation bilaterally with equal chest rise  Abdomen: Soft, nontender, nondistended, + bowel sounds  Extremities: warm dry without cyanosis clubbing or edema.  Neuro: AAOx3.  Strength 5/5 in upper and lower extremities.  Positive dysmetria.    Skin: Without rashes exudates or nodules.   Psych: Normal affect and demeanor with intact judgement and insight   Data Reviewed: Basic Metabolic Panel:  Recent Labs Lab 10/21/14 1121  NA 141  K 4.2  CL 102  CO2 24  GLUCOSE 150*  BUN 28*  CREATININE 1.33*  CALCIUM 10.2  Recent Labs Lab 10/21/14 1121  LIPASE 40   CBC:  Recent Labs Lab 10/21/14 1121  WBC 11.0*  NEUTROABS 6.6  HGB 14.1  HCT 42.6  MCV 90.8  PLT 246    Studies: Dg Chest 2 View  10/21/2014   CLINICAL DATA:  Weakness and dizziness  EXAM: CHEST  2 VIEW  COMPARISON:  02/08/2011  FINDINGS: Cardiac shadow is stable. Postoperative changes are again seen. The right hemidiaphragm is elevated. No focal infiltrate or sizable effusion is seen. Aortic calcifications are noted. No acute bony abnormality is seen  IMPRESSION: No acute abnormality noted.   Electronically Signed   By: Inez Catalina M.D.   On: 10/21/2014 13:31    Ct Head Wo Contrast  10/21/2014   CLINICAL DATA:  Dizziness, nausea and vomiting.  EXAM: CT HEAD WITHOUT CONTRAST  TECHNIQUE: Contiguous axial images were obtained from the base of the skull through the vertex without intravenous contrast.  COMPARISON:  None.  FINDINGS: Large infarct noted of the right cerebellar hemisphere which involves a small portion of the vermis. Based on density, this is not very acute and likely greater than 52 hr old. No associated overt hemorrhage or significant mass effect. The rest the brain shows advanced small vessel ischemic changes and cortical atrophy. No hydrocephalus identified. The skull is unremarkable.  IMPRESSION: Subacute infarct involving nearly the entire right cerebellar hemisphere and a small portion of the cerebellar vermis. This infarct is not associated with visible acute hemorrhage or significant mass effect.  Critical Value/emergent results were called by telephone at the time of interpretation on 10/21/2014 at 1:16 pm to Dr. Franchot Mimes , who verbally acknowledged these results.   Electronically Signed   By: Aletta Edouard M.D.   On: 10/21/2014 13:38   Mr Jodene Nam Head Wo Contrast  10/22/2014   CLINICAL DATA:  Gait imbalance for 1 week. On Coumadin for factor 5 Leiden deficiency. Cerebellar stroke seen on CT, here for further evaluation.  EXAM: MRI HEAD WITHOUT CONTRAST  MRA HEAD WITHOUT CONTRAST  TECHNIQUE: Multiplanar, multiecho pulse sequences of the brain and surrounding structures were obtained without intravenous contrast. Angiographic images of the head were obtained using MRA technique without contrast.  COMPARISON:  CT of the head November 12th 2015 at 1249 hr  FINDINGS: MRI HEAD FINDINGS  RIGHT inferior cerebellar reduced diffusion with corresponding nearly normalized ADC values and, bright T2 signal. Local mass effect though, fourth ventricle remains patent.  Punctate susceptibility artifact in LEFT occipital lobe. Moderate to severe  ventriculomegaly, likely on the basis of global parenchymal brain volume loss as there is overall commensurate enlargement of cerebral sulci and cerebellar folia, similar. Patchy to confluent supratentorial white matter FLAIR T2 hyperintensities suggest chronic small vessel ischemic disease.  No abnormal extra-axial fluid collections. Ocular globes and orbital contents are unremarkable. Trace LEFT maxillary sinus mucosal thickening without air-fluid levels. Small LEFT mastoid effusion. RIGHT mastoid air cells are well aerated. 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 No abnormal sellar expansion. Low signal about the odontoid process likely reflects cerumen. Patient is edentulous.  MRA HEAD FINDINGS  Anterior circulation: Normal flow related enhancement of the cervical, petrous, cavernous and supra clinoid internal carotid arteries though, there is slight irregularity of the vessels through these segments corresponding to calcific atherosclerosis seen on prior CT. Mildly motion degraded evaluation with normal flow related enhancement of the anterior middle cerebral arteries.  Posterior circulation: LEFT vertebral artery is dominant. Normal flow related enhancement vertebral arteries, basilar artery main  branch vessels. Normal pleural enhancement of posterior cerebral arteries.  No large vessel occlusion, hemodynamically significant stenosis, aneurysm, suspicious luminal irregularity.  IMPRESSION: MRI HEAD: Acute to subacute RIGHT posterior inferior cerebellar artery territory infarct.  Moderate to severe global parenchymal brain volume loss. Moderate white matter changes suggest chronic small vessel ischemic disease.  Solitary LEFT occipital lobe micro hemorrhage, nonspecific.  MRA HEAD: No acute vascular process nor hemodynamically significant stenosis.   Electronically Signed   By: Elon Alas   On: 10/22/2014 04:24   Mr Brain Wo Contrast  10/22/2014   CLINICAL DATA:  Gait  imbalance for 1 week. On Coumadin for factor 5 Leiden deficiency. Cerebellar stroke seen on CT, here for further evaluation.  EXAM: MRI HEAD WITHOUT CONTRAST  MRA HEAD WITHOUT CONTRAST  TECHNIQUE: Multiplanar, multiecho pulse sequences of the brain and surrounding structures were obtained without intravenous contrast. Angiographic images of the head were obtained using MRA technique without contrast.  COMPARISON:  CT of the head November 12th 2015 at 1249 hr  FINDINGS: MRI HEAD FINDINGS  RIGHT inferior cerebellar reduced diffusion with corresponding nearly normalized ADC values and, bright T2 signal. Local mass effect though, fourth ventricle remains patent.  Punctate susceptibility artifact in LEFT occipital lobe. Moderate to severe ventriculomegaly, likely on the basis of global parenchymal brain volume loss as there is overall commensurate enlargement of cerebral sulci and cerebellar folia, similar. Patchy to confluent supratentorial white matter FLAIR T2 hyperintensities suggest chronic small vessel ischemic disease.  No abnormal extra-axial fluid collections. Ocular globes and orbital contents are unremarkable. Trace LEFT maxillary sinus mucosal thickening without air-fluid levels. Small LEFT mastoid effusion. RIGHT mastoid air cells are well aerated. 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 No abnormal sellar expansion. Low signal about the odontoid process likely reflects cerumen. Patient is edentulous.  MRA HEAD FINDINGS  Anterior circulation: Normal flow related enhancement of the cervical, petrous, cavernous and supra clinoid internal carotid arteries though, there is slight irregularity of the vessels through these segments corresponding to calcific atherosclerosis seen on prior CT. Mildly motion degraded evaluation with normal flow related enhancement of the anterior middle cerebral arteries.  Posterior circulation: LEFT vertebral artery is dominant. Normal flow related  enhancement vertebral arteries, basilar artery main branch vessels. Normal pleural enhancement of posterior cerebral arteries.  No large vessel occlusion, hemodynamically significant stenosis, aneurysm, suspicious luminal irregularity.  IMPRESSION: MRI HEAD: Acute to subacute RIGHT posterior inferior cerebellar artery territory infarct.  Moderate to severe global parenchymal brain volume loss. Moderate white matter changes suggest chronic small vessel ischemic disease.  Solitary LEFT occipital lobe micro hemorrhage, nonspecific.  MRA HEAD: No acute vascular process nor hemodynamically significant stenosis.   Electronically Signed   By: Elon Alas   On: 10/22/2014 04:24    Scheduled Meds: . hydrocortisone-pramoxine  1 applicator Rectal QID  . omega-3 acid ethyl esters  1 g Oral BID  . pantoprazole  40 mg Oral Daily  . simvastatin  40 mg Oral QHS  . warfarin  5 mg Oral ONCE-1800  . Warfarin - Pharmacist Dosing Inpatient   Does not apply q1800   Continuous Infusions: . sodium chloride 50 mL/hr at 10/21/14 1804    Active Problems:   CAD (coronary artery disease)   Factor V Leiden   Hyperlipidemia   Hypertension   CVA (cerebral infarction)    Adelene Idler PA-S  [facility] Pager [phone]. If 7PM-7AM, please contact night-coverage at [email or web address], password [email or web address] 10/22/2014, 11:36 AM  LOS: 1 day  Addendum  Patient seen and examined, chart and data base reviewed.  I agree with the above assessment and plan.  For full details please see Mrs. Adelene Idler PA-S note.  I reviewed and amended the above note as appropriate.   Birdie Hopes, MD [facility] Pager: [phone] 10/22/2014, 1:30 PM

## 2014-10-22 NOTE — Evaluation (Signed)
Physical Therapy Evaluation Patient Details Name: Heather Alvarado MRN: [id number] DOB: 09-Jan-1923 Today's Date: 10/22/2014   History of Present Illness    Heather Alvarado is an 80 y.o. female on chronic coumadin for Factor V Leiden deficiency, who noted one week ago she was walking like she was drunk. She went to her INR clinic a week ago and was given medication for her dizziness and made a appointment to have a CT scan of her head today. All week she felt she was off balance swaying to both sides to the point she obtained a cane for balance. She went to her CT scan today and was told to go to ED due to a right cerebellar infarct. [name] is on coumadin and INR is 2.00. Currenlty patient has no complaints other than feeling "drunk when she is walking".    Clinical Impression  Pt currently with functional limitations due to decreased strength, balance, and mobility. Pt will benefit from skilled PT to increase independence and safety with mobility to allow discharge to SNF. Pt and son were in agreement that Pt would benefit from continued skilled care after discharge to improve safety and balance. Pt ambulated safely in hall with RW but had difficulty attending to obstacles on her Left. Pt required verbal cuing for safety with ambulation to avoid Left sided obstacles. Pt ambulated using a RW and only required min guard physical assist but increased to min assist for short distance ambulated without RW demonstrating Pt would benefit from AD at home for safety.     Follow Up Recommendations SNF    Equipment Recommendations  Rolling walker with 5" wheels    Recommendations for Other Services       Precautions / Restrictions Precautions Precautions: Fall Restrictions Weight Bearing Restrictions: No      Mobility  Bed Mobility Overal bed mobility: Needs Assistance Bed Mobility: Supine to Sit     Supine to sit: HOB elevated;Min guard     General bed mobility comments: Pt able to sit  up from supine with HOB highly elevated. Pt able to adjust position once sitting EOB.   Transfers Overall transfer level: Needs assistance Equipment used: Rolling walker (2 wheeled) Transfers: Sit to/from Stand Sit to Stand: Min assist         General transfer comment: Pt required min assist from PT for safety hwne mvoing sit to stand.   Ambulation/Gait Ambulation/Gait assistance: Min assist Ambulation Distance (Feet): 180 Feet Assistive device: Rolling walker (2 wheeled) Gait Pattern/deviations: Decreased stride length;Step-through pattern;Narrow base of support;Drifts right/left   Gait velocity interpretation: Below normal speed for age/gender General Gait Details: Pt demonstrated increased stability when ambulating with a RW. Pt required consistent verbal cuing to move off the wall when ambulating in the wall, particuarly on the Left side. Pt had difficulty avoiding obstacles on the Left side of the hallway.   Stairs            Wheelchair Mobility    Modified Rankin (Stroke Patients Only)       Balance Overall balance assessment: Needs assistance Sitting-balance support: Feet supported;No upper extremity supported Sitting balance-Leahy Scale: Good     Standing balance support: During functional activity;Bilateral upper extremity supported Standing balance-Leahy Scale: Fair                               Pertinent Vitals/Pain Pain Assessment: No/denies pain    Home Living Family/patient expects to  be discharged to:: Skilled nursing facility Living Arrangements: Spouse/significant other   Type of Home: House Home Access: [place]: Two level;Able to live on main level with bedroom/bathroom Home Equipment: Kasandra Knudsen - single point Additional Comments: Pt lives at home with husband who is wheelchair bound. Pt reports having an aid who comes in everyday for assistance but there is no one to help in the evenings. Pt reported using a  SPC a few days prior to admission but otherwise did not use an AD for ambulation and was a furniture/wall walker.     Prior Function Level of Independence: Independent               Hand Dominance        Extremity/Trunk Assessment               Lower Extremity Assessment: Overall WFL for tasks assessed         Communication   Communication: No difficulties  Cognition Arousal/Alertness: Awake/alert Behavior During Therapy: WFL for tasks assessed/performed Overall Cognitive Status: Within Functional Limits for tasks assessed                      General Comments General comments (skin integrity, edema, etc.): Pt showed PT that her IV insertion was damp. PT informed nurse and nurse came in to address when PT was leaving.     Exercises        Assessment/Plan    PT Assessment Patient needs continued PT services  PT Diagnosis Difficulty walking   PT Problem List Decreased strength;Decreased activity tolerance;Decreased balance;Decreased mobility;Decreased knowledge of use of DME  PT Treatment Interventions DME instruction;Gait training;Functional mobility training;Therapeutic activities;Therapeutic exercise;Balance training;Patient/family education   PT Goals (Current goals can be found in the Care Plan section) Acute Rehab PT Goals Patient Stated Goal: Return home PT Goal Formulation: With patient Time For Goal Achievement: 11/05/14 Potential to Achieve Goals: Good     Frequency Min 4X/week   Barriers to discharge        Co-evaluation               End of Session Equipment Utilized During Treatment: Gait belt Activity Tolerance: Patient tolerated treatment well Patient left: in chair;with call bell/phone within reach Nurse Communication: Other (comment) (IV insertion damp)         Time: 8850-2774 PT Time Calculation (min) (ACUTE ONLY): 33 min   Charges:   PT Evaluation $Initial PT Evaluation Tier I: 1 Procedure PT  Treatments $Gait Training: 8-22 mins $Self Care/Home Management: 8-22   PT G CodesJearld Shines, SPT 10/22/2014, 3:57 PM  Jearld Shines, [place]  [facility] [phone]

## 2014-10-22 NOTE — Plan of Care (Signed)
Problem: Progression Outcomes Goal: Communication method established Outcome: Completed/Met Date Met:  10/22/14 Goal: If vent dependent, tolerates weaning Outcome: Not Applicable Date Met:  67/70/34 Goal: Pain controlled Outcome: Completed/Met Date Met:  10/22/14 Goal: Bowel & Bladder Continence Outcome: Completed/Met Date Met:  10/22/14

## 2014-10-23 DIAGNOSIS — I63349 Cerebral infarction due to thrombosis of unspecified cerebellar artery: Secondary | ICD-10-CM

## 2014-10-23 LAB — PROTIME-INR
INR: 2.75 — AB (ref 0.00–1.49)
Prothrombin Time: 29.3 seconds — ABNORMAL HIGH (ref 11.6–15.2)

## 2014-10-23 MED ORDER — WARFARIN SODIUM 2.5 MG PO TABS
2.5000 mg | ORAL_TABLET | Freq: Once | ORAL | Status: AC
  Administered 2014-10-23: 2.5 mg via ORAL
  Filled 2014-10-23: qty 1

## 2014-10-23 NOTE — Progress Notes (Signed)
ANTICOAGULATION CONSULT NOTE - Follow Up Consult  Pharmacy Consult for Warfarin Indication: Factor V Leiden deficiency + Recent CVA  Allergies  Allergen Reactions  . Ivp Dye [Iodinated Diagnostic Agents] Hives    Patient Measurements: Height: 5\' 2"  (157.5 cm) Weight: 125 lb 11.2 oz (57.017 kg) IBW/kg (Calculated) : 50.1  Vital Signs: Temp: 98.2 F (36.8 C) (11/14 0400) Temp Source: Oral (11/14 0400) BP: 130/68 mmHg (11/14 0859) Pulse Rate: 74 (11/14 0859)  Labs:  Recent Labs  10/21/14 1121 10/22/14 0542 10/23/14 0403  HGB 14.1  --   --   HCT 42.6  --   --   PLT 246  --   --   LABPROT 22.9* 25.0* 29.3*  INR 2.00* 2.24* 2.75*  CREATININE 1.33*  --   --     Estimated Creatinine Clearance: 22.7 mL/min (by C-G formula based on Cr of 1.33).   Assessment: 43 yoF on Coumadin PTA for history of Factor V Leidin deficiency. On admission, MRI showed large subacute infarct involving right cerebellum. Her INR trended up from 2 to 2.75 after 2, 5 mg doses. In the setting of an active clot and her INR trending up fairly rapidly, will reduce the dose tonight.  Goal of Therapy:  INR 2-3 Monitor platelets by anticoagulation protocol: Yes   Plan:  - Coumadin 2.5 mg X1  - Daily PT / INR  Theron Arista, PharmD Clinical Pharmacist - Resident Pager: [phone] 11/14/201511:45 AM

## 2014-10-23 NOTE — Plan of Care (Signed)
Problem: Progression Outcomes Goal: Progressive activity as tolerated Outcome: Progressing Goal: Tolerating diet/TF at goal rate Outcome: Completed/Met Date Met:  10/23/14 Goal: Educational plan initiated Outcome: Completed/Met Date Met:  10/23/14

## 2014-10-23 NOTE — Plan of Care (Signed)
Problem: Progression Outcomes Goal: Progressive activity as tolerated Outcome: Completed/Met Date Met:  10/23/14 Goal: Initial discharge plan initiated Outcome: Completed/Met Date Met:  10/23/14

## 2014-10-23 NOTE — Progress Notes (Signed)
PROGRESS NOTE  Heather Alvarado PPJ:[id number] DOB: 07-14-1923 DOA: 10/21/2014 PCP: Precious Reel, MD  HPI/Subjective: 80 yo female presents with complaints of feeling wobbly and having unsteady gait x 1 week.  She describes it as feeling drunk.  PCP ordered head CT and carotid doppler study.  Pt is on Warfarin for factor V Leyden deficiency with an INR of 2.  PMH of CAD, hyperlipidemia, and hypertension.    Pt is well appearing without any other complaints other than unsteadiness.    Assessment/Plan:  CVA (cerebral infarction) Infarct likely occurred 1 week ago with onset of symptoms. MRI of brain shows acute to subacute RIGHT posterior inferior cerebellar artery territory infarct.  Head CT shows subacute infarct involving nearly the entire right cerebellar hemisphere and a small portion of the cerebellar vermis. This infarct is not associated with visible acute hemorrhage or significant mass effect.  INR now 2.24.  2D Echo, carotid doppler, and carotid duplex ordered.   Will try to achieve INR between 2.5 and 3.0. Records from Dr. Virgina Jock showing INR 1.7-1.9. PT evaluated the patient recommend to SNF, CSW consulted.  Factor V Leiden Chronic problem.  Managed on Coumadin.  INR at admission was 2.0.  Will continue to monitor INR.  Will try to achieve INR at higher end of 2.0-3.0 range.   CAD (coronary artery disease) Fasting lipid panel WNL.  Carotid doppler and duplex ordered.   Hyperlipidemia Chronic problem.  Well controlled.  Fasting lipid panel WNL.  Continue Simvastatin and Fish oil.    Hypertension Chronic problem.  Last BP 161/54.  Continue Imdur, HCTZ, and Olmesartan.        DVT Prophylaxis:  SCDs  Code Status: DNR Family Communication: Pt is awake and alert.  Disposition Plan: Will discharge pt home when medically appropriate.   Consultants:  Neurology  Procedures:  2D Echo  Carotid doppler  Carotid duplex  Antibiotics:  None  Objective: Filed Vitals:    10/23/14 0000 10/23/14 0400 10/23/14 0625 10/23/14 0859  BP: 138/60 129/66  130/68  Pulse: 72 79  74  Temp: 98.3 F (36.8 C) 98.2 F (36.8 C)    TempSrc: Oral Oral    Resp: 20 20    Height:      Weight:   57.017 kg (125 lb 11.2 oz)   SpO2: 97% 94%      Intake/Output Summary (Last 24 hours) at 10/23/14 1251 Last data filed at 10/23/14 0239  Gross per 24 hour  Intake    590 ml  Output    450 ml  Net    140 ml   Filed Weights   10/21/14 1112 10/22/14 0530 10/23/14 0625  Weight: 58.968 kg (130 lb) 56.836 kg (125 lb 4.8 oz) 57.017 kg (125 lb 11.2 oz)    Exam: General: Well developed, well nourished, NAD, appears younger than stated age  11:  EOMI, Anicteic Sclera, MMM.  Cardiovascular: RRR, S1 S2 auscultated, no rubs, murmurs or gallops.   Respiratory: Clear to auscultation bilaterally with equal chest rise  Abdomen: Soft, nontender, nondistended, + bowel sounds  Extremities: warm dry without cyanosis clubbing or edema.  Neuro: AAOx3.  Strength 5/5 in upper and lower extremities.  Positive dysmetria.    Skin: Without rashes exudates or nodules.   Psych: Normal affect and demeanor with intact judgement and insight   Data Reviewed: Basic Metabolic Panel:  Recent Labs Lab 10/21/14 1121  NA 141  K 4.2  CL 102  CO2 24  GLUCOSE 150*  BUN 28*  CREATININE 1.33*  CALCIUM 10.2    Recent Labs Lab 10/21/14 1121  LIPASE 40   CBC:  Recent Labs Lab 10/21/14 1121  WBC 11.0*  NEUTROABS 6.6  HGB 14.1  HCT 42.6  MCV 90.8  PLT 246    Studies: Dg Chest 2 View  10/21/2014   CLINICAL DATA:  Weakness and dizziness  EXAM: CHEST  2 VIEW  COMPARISON:  02/08/2011  FINDINGS: Cardiac shadow is stable. Postoperative changes are again seen. The right hemidiaphragm is elevated. No focal infiltrate or sizable effusion is seen. Aortic calcifications are noted. No acute bony abnormality is seen  IMPRESSION: No acute abnormality noted.   Electronically Signed   By: Inez Catalina  M.D.   On: 10/21/2014 13:31   Mr Jodene Nam Head Wo Contrast  10/22/2014   CLINICAL DATA:  Gait imbalance for 1 week. On Coumadin for factor 5 Leiden deficiency. Cerebellar stroke seen on CT, here for further evaluation.  EXAM: MRI HEAD WITHOUT CONTRAST  MRA HEAD WITHOUT CONTRAST  TECHNIQUE: Multiplanar, multiecho pulse sequences of the brain and surrounding structures were obtained without intravenous contrast. Angiographic images of the head were obtained using MRA technique without contrast.  COMPARISON:  CT of the head November 12th 2015 at 1249 hr  FINDINGS: MRI HEAD FINDINGS  RIGHT inferior cerebellar reduced diffusion with corresponding nearly normalized ADC values and, bright T2 signal. Local mass effect though, fourth ventricle remains patent.  Punctate susceptibility artifact in LEFT occipital lobe. Moderate to severe ventriculomegaly, likely on the basis of global parenchymal brain volume loss as there is overall commensurate enlargement of cerebral sulci and cerebellar folia, similar. Patchy to confluent supratentorial white matter FLAIR T2 hyperintensities suggest chronic small vessel ischemic disease.  No abnormal extra-axial fluid collections. Ocular globes and orbital contents are unremarkable. Trace LEFT maxillary sinus mucosal thickening without air-fluid levels. Small LEFT mastoid effusion. RIGHT mastoid air cells are well aerated. 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 No abnormal sellar expansion. Low signal about the odontoid process likely reflects cerumen. Patient is edentulous.  MRA HEAD FINDINGS  Anterior circulation: Normal flow related enhancement of the cervical, petrous, cavernous and supra clinoid internal carotid arteries though, there is slight irregularity of the vessels through these segments corresponding to calcific atherosclerosis seen on prior CT. Mildly motion degraded evaluation with normal flow related enhancement of the anterior middle cerebral  arteries.  Posterior circulation: LEFT vertebral artery is dominant. Normal flow related enhancement vertebral arteries, basilar artery main branch vessels. Normal pleural enhancement of posterior cerebral arteries.  No large vessel occlusion, hemodynamically significant stenosis, aneurysm, suspicious luminal irregularity.  IMPRESSION: MRI HEAD: Acute to subacute RIGHT posterior inferior cerebellar artery territory infarct.  Moderate to severe global parenchymal brain volume loss. Moderate white matter changes suggest chronic small vessel ischemic disease.  Solitary LEFT occipital lobe micro hemorrhage, nonspecific.  MRA HEAD: No acute vascular process nor hemodynamically significant stenosis.   Electronically Signed   By: Elon Alas   On: 10/22/2014 04:24   Mr Brain Wo Contrast  10/22/2014   CLINICAL DATA:  Gait imbalance for 1 week. On Coumadin for factor 5 Leiden deficiency. Cerebellar stroke seen on CT, here for further evaluation.  EXAM: MRI HEAD WITHOUT CONTRAST  MRA HEAD WITHOUT CONTRAST  TECHNIQUE: Multiplanar, multiecho pulse sequences of the brain and surrounding structures were obtained without intravenous contrast. Angiographic images of the head were obtained using MRA technique without contrast.  COMPARISON:  CT of the head November 12th 2015  at 1249 hr  FINDINGS: MRI HEAD FINDINGS  RIGHT inferior cerebellar reduced diffusion with corresponding nearly normalized ADC values and, bright T2 signal. Local mass effect though, fourth ventricle remains patent.  Punctate susceptibility artifact in LEFT occipital lobe. Moderate to severe ventriculomegaly, likely on the basis of global parenchymal brain volume loss as there is overall commensurate enlargement of cerebral sulci and cerebellar folia, similar. Patchy to confluent supratentorial white matter FLAIR T2 hyperintensities suggest chronic small vessel ischemic disease.  No abnormal extra-axial fluid collections. Ocular globes and orbital  contents are unremarkable. Trace LEFT maxillary sinus mucosal thickening without air-fluid levels. Small LEFT mastoid effusion. RIGHT mastoid air cells are well aerated. 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 No abnormal sellar expansion. Low signal about the odontoid process likely reflects cerumen. Patient is edentulous.  MRA HEAD FINDINGS  Anterior circulation: Normal flow related enhancement of the cervical, petrous, cavernous and supra clinoid internal carotid arteries though, there is slight irregularity of the vessels through these segments corresponding to calcific atherosclerosis seen on prior CT. Mildly motion degraded evaluation with normal flow related enhancement of the anterior middle cerebral arteries.  Posterior circulation: LEFT vertebral artery is dominant. Normal flow related enhancement vertebral arteries, basilar artery main branch vessels. Normal pleural enhancement of posterior cerebral arteries.  No large vessel occlusion, hemodynamically significant stenosis, aneurysm, suspicious luminal irregularity.  IMPRESSION: MRI HEAD: Acute to subacute RIGHT posterior inferior cerebellar artery territory infarct.  Moderate to severe global parenchymal brain volume loss. Moderate white matter changes suggest chronic small vessel ischemic disease.  Solitary LEFT occipital lobe micro hemorrhage, nonspecific.  MRA HEAD: No acute vascular process nor hemodynamically significant stenosis.   Electronically Signed   By: Elon Alas   On: 10/22/2014 04:24    Scheduled Meds: . hydrocortisone-pramoxine  1 applicator Rectal QID  . omega-3 acid ethyl esters  1 g Oral BID  . pantoprazole  40 mg Oral Daily  . simvastatin  40 mg Oral QHS  . warfarin  2.5 mg Oral ONCE-1800  . Warfarin - Pharmacist Dosing Inpatient   Does not apply q1800   Continuous Infusions: . sodium chloride 50 mL/hr at 10/21/14 1804    Active Problems:   CAD (coronary artery disease)   Factor V  Leiden   Hyperlipidemia   Hypertension   CVA (cerebral infarction)    Birdie Hopes, MD  [facility] Pager [phone]. If 7PM-7AM, please contact night-coverage at [email or web address], password [facility] 10/23/2014, 12:51 PM  LOS: 2 days

## 2014-10-24 LAB — PROTIME-INR
INR: 2.82 — ABNORMAL HIGH (ref 0.00–1.49)
Prothrombin Time: 29.9 seconds — ABNORMAL HIGH (ref 11.6–15.2)

## 2014-10-24 LAB — BASIC METABOLIC PANEL
Anion gap: 11 (ref 5–15)
BUN: 17 mg/dL (ref 6–23)
CALCIUM: 9.1 mg/dL (ref 8.4–10.5)
CO2: 23 mEq/L (ref 19–32)
Chloride: 106 mEq/L (ref 96–112)
Creatinine, Ser: 1.15 mg/dL — ABNORMAL HIGH (ref 0.50–1.10)
GFR calc Af Amer: 47 mL/min — ABNORMAL LOW (ref >90)
GFR calc non Af Amer: 41 mL/min — ABNORMAL LOW (ref >90)
GLUCOSE: 91 mg/dL (ref 70–99)
Potassium: 3.9 mEq/L (ref 3.7–5.3)
Sodium: 140 mEq/L (ref 137–147)

## 2014-10-24 MED ORDER — IRBESARTAN 75 MG PO TABS
75.0000 mg | ORAL_TABLET | Freq: Every day | ORAL | Status: DC
  Administered 2014-10-24 – 2014-10-25 (×2): 75 mg via ORAL
  Filled 2014-10-24 (×2): qty 1

## 2014-10-24 MED ORDER — WARFARIN SODIUM 2.5 MG PO TABS
2.5000 mg | ORAL_TABLET | Freq: Once | ORAL | Status: AC
  Administered 2014-10-24: 2.5 mg via ORAL
  Filled 2014-10-24: qty 1

## 2014-10-24 MED ORDER — HYDROCHLOROTHIAZIDE 25 MG PO TABS
25.0000 mg | ORAL_TABLET | Freq: Every day | ORAL | Status: DC
  Administered 2014-10-24 – 2014-10-25 (×2): 25 mg via ORAL
  Filled 2014-10-24 (×2): qty 1

## 2014-10-24 NOTE — Progress Notes (Signed)
ANTICOAGULATION CONSULT NOTE - Follow Up Consult  Pharmacy Consult for Warfarin Indication: Factor V Leiden Deficiency + Recent CVA  Allergies  Allergen Reactions  . Ivp Dye [Iodinated Diagnostic Agents] Hives    Patient Measurements: Height: 5\' 2"  (157.5 cm) Weight: 129 lb 6.4 oz (58.695 kg) IBW/kg (Calculated) : 50.1  Vital Signs: Temp: 98.2 F (36.8 C) (11/15 0400) Temp Source: Oral (11/15 0823) BP: 141/48 mmHg (11/15 0823) Pulse Rate: 77 (11/15 0823)  Labs:  Recent Labs  10/21/14 1121 10/22/14 0542 10/23/14 0403 10/24/14 0351  HGB 14.1  --   --   --   HCT 42.6  --   --   --   PLT 246  --   --   --   LABPROT 22.9* 25.0* 29.3* 29.9*  INR 2.00* 2.24* 2.75* 2.82*  CREATININE 1.33*  --   --  1.15*    Estimated Creatinine Clearance: 26.2 mL/min (by C-G formula based on Cr of 1.15).   Assessment: 51 yoF on Coumadin for history of Factor V Leidin deficiency and recent stroke.  Her INR trended up from 2 to 2.75 after 2, 5 mg doses. Due to her active clot, reduced dose to 2.5 mg yesterday rather than holding dose with her INR trending up fairly rapidly. Today her INR is therapeutic at 2.82.    Goal of Therapy:  INR 2-3 (Per Dr. Hartford Poli, want to target upper end of 2-3 range) Monitor platelets by anticoagulation protocol: Yes   Plan:  - Coumadin 2.5 mg X1  - Daily PT / INR   Theron Arista, PharmD Clinical Pharmacist - Resident Pager: [phone] 11/15/201510:36 AM

## 2014-10-24 NOTE — Clinical Social Work Psychosocial (Signed)
Clinical Social Work Department BRIEF PSYCHOSOCIAL ASSESSMENT 10/24/2014  Patient:  Heather Alvarado, Heather Alvarado     Account Number:  [id number]     Admit date:  10/21/2014  Clinical Social Worker:  Hubert Azure  Date/Time:  10/24/2014 04:11 PM  Referred by:  Physician  Date Referred:  10/24/2014 Referred for  SNF Placement   Other Referral:   Interview type:  Family Other interview type:   Patient's son and daughter were present at bedside.    PSYCHOSOCIAL DATA Living Status:  HUSBAND Admitted from facility:   Level of care:   Primary support name:  Konrad Dolores (son) Melody Emmaline Life (daughter) [phone] Primary support relationship to patient:  CHILD, ADULT Degree of support available:   Good    CURRENT CONCERNS Current Concerns  Post-Acute Placement   Other Concerns:    SOCIAL WORK ASSESSMENT / PLAN CSW met with patient, and patient's son and daughter who were present at bedside. CSW introduced self and explained role. CSW explained SNF placement process and discussed d/c plan. Per patient she lives with husband at home. Patient states she and a caregiver care for husband. Per son, he resides in [place] and patient's daughter resides in [place], [place]. Patient and children are agreeable to SNF placement and prefer [facility] first and [facility] second.   Assessment/plan status:  Other - See comment Other assessment/ plan:   CSW to complete FL2 and submit PASARR for SNF placement.   Information/referral to community resources:    PATIENT'S/FAMILY'S RESPONSE TO PLAN OF CARE: Patient, son, and daughter were pleasant and agreeable to SNF, prefer [facility].    [place], [place] Weekend Clinical Social Worker [phone]

## 2014-10-24 NOTE — Progress Notes (Signed)
PROGRESS NOTE  Heather Alvarado XLK:[id number] DOB: November 14, 1923 DOA: 10/21/2014 PCP: Precious Reel, MD  HPI/Subjective: 80 yo female presents with complaints of feeling wobbly and having unsteady gait x 1 week.  She describes it as feeling drunk.  PCP ordered head CT and carotid doppler study.  Pt is on Warfarin for factor V Leyden deficiency with an INR of 2.  PMH of CAD, hyperlipidemia, and hypertension.    No complaints, currently had transient headache yesterday.   Assessment/Plan:  CVA (cerebral infarction) Infarct likely occurred 1 week ago with onset of symptoms. MRI of brain shows acute to subacute RIGHT posterior inferior cerebellar artery territory infarct.  Head CT shows subacute infarct involving nearly the entire right cerebellar hemisphere and a small portion of the cerebellar vermis. This infarct is not associated with visible acute hemorrhage or significant mass effect.  INR now 2.82 2D Echo, carotid doppler, and carotid duplex ordered.   Will try to achieve INR between 2.5 and 3.0. Records from Dr. Virgina Jock showing INR 1.7-1.9. PT evaluated the patient recommend to SNF, CSW consulted.  Factor V Leiden Chronic problem.  Managed on Coumadin.  INR at admission was 2.0.  Will continue to monitor INR.   Will try to achieve INR at higher end of 2.0-3.0 range.   CAD (coronary artery disease) Fasting lipid panel WNL.  Carotid doppler and duplex ordered.   Hyperlipidemia Chronic problem.  Well controlled.  Fasting lipid panel WNL.  Continue Simvastatin and Fish oil.    Hypertension Chronic problem.  Last BP 161/54.  Continue Imdur, HCTZ, and Olmesartan.        DVT Prophylaxis:  SCDs  Code Status: DNR Family Communication: Pt is awake and alert.  Disposition Plan: Will discharge pt home when medically appropriate.   Consultants:  Neurology  Procedures:  2D Echo  Carotid doppler  Carotid duplex  Antibiotics:  None  Objective: Filed Vitals:   10/23/14  2000 10/24/14 0009 10/24/14 0400 10/24/14 0823  BP: 118/57 131/52 143/65 141/48  Pulse: 78 75 72 77  Temp: 98 F (36.7 C) 97.9 F (36.6 C) 98.2 F (36.8 C)   TempSrc:    Oral  Resp: 16 12 16 18   Height:      Weight:   58.695 kg (129 lb 6.4 oz)   SpO2: 95% 94% 94% 94%    Intake/Output Summary (Last 24 hours) at 10/24/14 1006 Last data filed at 10/24/14 0919  Gross per 24 hour  Intake 1862.5 ml  Output    400 ml  Net 1462.5 ml   Filed Weights   10/22/14 0530 10/23/14 0625 10/24/14 0400  Weight: 56.836 kg (125 lb 4.8 oz) 57.017 kg (125 lb 11.2 oz) 58.695 kg (129 lb 6.4 oz)    Exam: General: Well developed, well nourished, NAD, appears younger than stated age  70:  EOMI, Anicteic Sclera, MMM.  Cardiovascular: RRR, S1 S2 auscultated, no rubs, murmurs or gallops.   Respiratory: Clear to auscultation bilaterally with equal chest rise  Abdomen: Soft, nontender, nondistended, + bowel sounds  Extremities: warm dry without cyanosis clubbing or edema.  Neuro: AAOx3.  Strength 5/5 in upper and lower extremities.  Positive dysmetria.    Skin: Without rashes exudates or nodules.   Psych: Normal affect and demeanor with intact judgement and insight   Data Reviewed: Basic Metabolic Panel:  Recent Labs Lab 10/21/14 1121 10/24/14 0351  NA 141 140  K 4.2 3.9  CL 102 106  CO2 24 23  GLUCOSE 150*  91  BUN 28* 17  CREATININE 1.33* 1.15*  CALCIUM 10.2 9.1    Recent Labs Lab 10/21/14 1121  LIPASE 40   CBC:  Recent Labs Lab 10/21/14 1121  WBC 11.0*  NEUTROABS 6.6  HGB 14.1  HCT 42.6  MCV 90.8  PLT 246    Studies: No results found.  Scheduled Meds: . hydrocortisone-pramoxine  1 applicator Rectal QID  . omega-3 acid ethyl esters  1 g Oral BID  . pantoprazole  40 mg Oral Daily  . simvastatin  40 mg Oral QHS  . Warfarin - Pharmacist Dosing Inpatient   Does not apply q1800   Continuous Infusions: . sodium chloride 50 mL/hr at 10/23/14 2000    Active  Problems:   CAD (coronary artery disease)   Factor V Leiden   Hyperlipidemia   Hypertension   CVA (cerebral infarction)    Birdie Hopes, MD  [facility] Pager [phone]. If 7PM-7AM, please contact night-coverage at [email or web address], password [facility] 10/24/2014, 10:06 AM  LOS: 3 days

## 2014-10-25 DIAGNOSIS — R262 Difficulty in walking, not elsewhere classified: Secondary | ICD-10-CM | POA: Diagnosis not present

## 2014-10-25 DIAGNOSIS — I63349 Cerebral infarction due to thrombosis of unspecified cerebellar artery: Secondary | ICD-10-CM | POA: Diagnosis not present

## 2014-10-25 DIAGNOSIS — R531 Weakness: Secondary | ICD-10-CM | POA: Diagnosis not present

## 2014-10-25 DIAGNOSIS — M62838 Other muscle spasm: Secondary | ICD-10-CM | POA: Diagnosis not present

## 2014-10-25 DIAGNOSIS — I10 Essential (primary) hypertension: Secondary | ICD-10-CM | POA: Diagnosis not present

## 2014-10-25 DIAGNOSIS — I25119 Atherosclerotic heart disease of native coronary artery with unspecified angina pectoris: Secondary | ICD-10-CM | POA: Diagnosis not present

## 2014-10-25 DIAGNOSIS — R42 Dizziness and giddiness: Secondary | ICD-10-CM | POA: Insufficient documentation

## 2014-10-25 DIAGNOSIS — R278 Other lack of coordination: Secondary | ICD-10-CM | POA: Diagnosis not present

## 2014-10-25 DIAGNOSIS — I69398 Other sequelae of cerebral infarction: Secondary | ICD-10-CM | POA: Diagnosis not present

## 2014-10-25 DIAGNOSIS — M6281 Muscle weakness (generalized): Secondary | ICD-10-CM | POA: Diagnosis not present

## 2014-10-25 DIAGNOSIS — E785 Hyperlipidemia, unspecified: Secondary | ICD-10-CM | POA: Diagnosis not present

## 2014-10-25 DIAGNOSIS — I69828 Other speech and language deficits following other cerebrovascular disease: Secondary | ICD-10-CM | POA: Diagnosis not present

## 2014-10-25 DIAGNOSIS — E876 Hypokalemia: Secondary | ICD-10-CM | POA: Diagnosis not present

## 2014-10-25 DIAGNOSIS — R791 Abnormal coagulation profile: Secondary | ICD-10-CM | POA: Diagnosis not present

## 2014-10-25 DIAGNOSIS — R2689 Other abnormalities of gait and mobility: Secondary | ICD-10-CM | POA: Diagnosis not present

## 2014-10-25 DIAGNOSIS — D6851 Activated protein C resistance: Secondary | ICD-10-CM | POA: Diagnosis not present

## 2014-10-25 DIAGNOSIS — I2510 Atherosclerotic heart disease of native coronary artery without angina pectoris: Secondary | ICD-10-CM | POA: Diagnosis not present

## 2014-10-25 DIAGNOSIS — K219 Gastro-esophageal reflux disease without esophagitis: Secondary | ICD-10-CM | POA: Diagnosis not present

## 2014-10-25 DIAGNOSIS — I639 Cerebral infarction, unspecified: Secondary | ICD-10-CM | POA: Diagnosis not present

## 2014-10-25 DIAGNOSIS — M810 Age-related osteoporosis without current pathological fracture: Secondary | ICD-10-CM | POA: Diagnosis not present

## 2014-10-25 DIAGNOSIS — E559 Vitamin D deficiency, unspecified: Secondary | ICD-10-CM | POA: Diagnosis not present

## 2014-10-25 DIAGNOSIS — I739 Peripheral vascular disease, unspecified: Secondary | ICD-10-CM | POA: Diagnosis not present

## 2014-10-25 DIAGNOSIS — R41841 Cognitive communication deficit: Secondary | ICD-10-CM | POA: Diagnosis not present

## 2014-10-25 LAB — PROTIME-INR
INR: 2.51 — ABNORMAL HIGH (ref 0.00–1.49)
PROTHROMBIN TIME: 27.3 s — AB (ref 11.6–15.2)

## 2014-10-25 MED ORDER — TRAMADOL HCL 50 MG PO TABS: 50.0000 mg | ORAL_TABLET | Freq: Four times a day (QID) | ORAL | Status: AC | PRN

## 2014-10-25 NOTE — Clinical Social Work Placement (Addendum)
    Clinical Social Work Department CLINICAL SOCIAL WORK PLACEMENT NOTE 10/25/2014  Patient:  [name], [name]  Account Number:  [id number] Admit date:  10/21/2014  Clinical Social Worker:  Adair Laundry  Date/time:  10/25/2014 10:50 AM  Clinical Social Work is seeking post-discharge placement for this patient at the following level of care:   SKILLED NURSING   (*CSW will update this form in Epic as items are completed)   10/25/2014  Patient/family provided with [facility] Department of Clinical Social Work's list of facilities offering this level of care within the geographic area requested by the patient (or if unable, by the patient's family).  10/25/2014  Patient/family informed of their freedom to choose among providers that offer the needed level of care, that participate in Medicare, Medicaid or managed care program needed by the patient, have an available bed and are willing to accept the patient.  10/25/2014  Patient/family informed of [facility]' ownership interest in [facility], as well as of the fact that they are under no obligation to receive care at this facility.  PASARR submitted to [employer] on 10/25/2014 PASARR number received on 10/25/2014  FL2 transmitted to all facilities in geographic area requested by pt/family on  10/25/2014 FL2 transmitted to all facilities within larger geographic area on   Patient informed that his/her managed care company has contracts with or will negotiate with  certain facilities, including the following:     Patient/family informed of bed offers received:  10/25/2014 Patient chooses bed at [facility] Physician recommends and patient chooses bed at    Patient to be transferred [place]  on  10/25/2014 Patient to be transferred to facility by [facility] (family) Patient and family notified of transfer on 10/25/2014 Name of family member notified:  Konrad Dolores  The following physician request were entered in  Epic: Physician Request  Please sign FL2.    Additional CommentsBerton Mount, Ali Molina

## 2014-10-25 NOTE — Discharge Instructions (Signed)
Information on my medicine - Coumadin   (Warfarin)  This medication education was reviewed with me or my healthcare representative as part of my discharge preparation.  The pharmacist that spoke with me during my hospital stay was:  McCallister, Maud Deed, [facility]  Why was Coumadin prescribed for you? Coumadin was prescribed for you because you have a blood clot or a medical condition that can cause an increased risk of forming blood clots. Blood clots can cause serious health problems by blocking the flow of blood to the heart, lung, or brain. Coumadin can prevent harmful blood clots from forming. As a reminder your indication for Coumadin is:   Blood Clotting Disorder  What test will check on my response to Coumadin? While on Coumadin (warfarin) you will need to have an INR test regularly to ensure that your dose is keeping you in the desired range. The INR (international normalized ratio) number is calculated from the result of the laboratory test called prothrombin time (PT).  If an INR APPOINTMENT HAS NOT ALREADY BEEN MADE FOR YOU please schedule an appointment to have this lab work done by your health care provider within 7 days. Your INR goal is usually a number between:  2 to 3 or your provider may give you a more narrow range like 2-2.5.  Ask your health care provider during an office visit what your goal INR is.  What  do you need to  know  About  COUMADIN? Take Coumadin (warfarin) exactly as prescribed by your healthcare provider about the same time each day.  DO NOT stop taking without talking to the doctor who prescribed the medication.  Stopping without other blood clot prevention medication to take the place of Coumadin may increase your risk of developing a new clot or stroke.  Get refills before you run out.  What do you do if you miss a dose? If you miss a dose, take it as soon as you remember on the same day then continue your regularly scheduled regimen the next day.  Do not take  two doses of Coumadin at the same time.  Important Safety Information A possible side effect of Coumadin (Warfarin) is an increased risk of bleeding. You should call your healthcare provider right away if you experience any of the following: ? Bleeding from an injury or your nose that does not stop. ? Unusual colored urine (red or dark brown) or unusual colored stools (red or black). ? Unusual bruising for unknown reasons. ? A serious fall or if you hit your head (even if there is no bleeding).  Some foods or medicines interact with Coumadin (warfarin) and might alter your response to warfarin. To help avoid this: ? Eat a balanced diet, maintaining a consistent amount of Vitamin K. ? Notify your provider about major diet changes you plan to make. ? Avoid alcohol or limit your intake to 1 drink for women and 2 drinks for men per day. (1 drink is 5 oz. wine, 12 oz. beer, or 1.5 oz. liquor.)  Make sure that ANY health care provider who prescribes medication for you knows that you are taking Coumadin (warfarin).  Also make sure the healthcare provider who is monitoring your Coumadin knows when you have started a new medication including herbals and non-prescription products.  Coumadin (Warfarin)  Major Drug Interactions  Increased Warfarin Effect Decreased Warfarin Effect  Alcohol (large quantities) Antibiotics (esp. Septra/Bactrim, Flagyl, Cipro) Amiodarone (Cordarone) Aspirin (ASA) Cimetidine (Tagamet) Megestrol (Megace) NSAIDs (ibuprofen, naproxen, etc.) Piroxicam (  Feldene) Propafenone (Rythmol SR) Propranolol (Inderal) Isoniazid (INH) Posaconazole (Noxafil) Barbiturates (Phenobarbital) Carbamazepine (Tegretol) Chlordiazepoxide (Librium) Cholestyramine (Questran) Griseofulvin Oral Contraceptives Rifampin Sucralfate (Carafate) Vitamin K   Coumadin (Warfarin) Major Herbal Interactions  Increased Warfarin Effect Decreased Warfarin Effect  Garlic Ginseng Ginkgo biloba  Coenzyme Q10 Green tea St. Johns wort    Coumadin (Warfarin) FOOD Interactions  Eat a consistent number of servings per week of foods HIGH in Vitamin K (1 serving =  cup)  Collards (cooked, or boiled & drained) Kale (cooked, or boiled & drained) Mustard greens (cooked, or boiled & drained) Parsley *serving size only =  cup Spinach (cooked, or boiled & drained) Swiss chard (cooked, or boiled & drained) Turnip greens (cooked, or boiled & drained)  Eat a consistent number of servings per week of foods MEDIUM-HIGH in Vitamin K (1 serving = 1 cup)  Asparagus (cooked, or boiled & drained) Broccoli (cooked, boiled & drained, or raw & chopped) Brussel sprouts (cooked, or boiled & drained) *serving size only =  cup Lettuce, raw (green leaf, endive, romaine) Spinach, raw Turnip greens, raw & chopped   These websites have more information on Coumadin (warfarin):  FailFactory.se; VeganReport.com.au;  Ischemic Stroke Blood carries oxygen to all areas of your body. A stroke happens when your blood does not flow to your brain like normal. If this happens, your brain will not get the oxygen it needs and brain tissue will die. This is an emergency. Problems (symptoms) of a stroke usually happen suddenly. You may notice them when you wake up. They can include:  Loss of feeling or weakness on one side of the body (face, arm, leg).  Feeling confused.  Trouble talking or understanding.  Trouble seeing.  Trouble walking.  Feeling dizzy.  Loss of balance or coordination.  Severe headache without a cause.  Trouble reading or writing. Get help as soon as any of these problems first start. This is important.  RISK FACTORS  Risk factors are things that make it more likely for you to have a stroke. These things include:  High blood pressure (hypertension).  High cholesterol.  Diabetes.  Heart disease.  Having a buildup of fatty deposits in the blood  vessels.  Having an abnormal heart rhythm (atrial fibrillation).  Being very overweight (obese).  Smoking.  Taking birth control pills, especially if you smoke.  Not being active.  Having a diet high in fats, salt, and calories.  Drinking too much alcohol.  Using illegal drugs.  Being African American.  Being over the age of 41.  Having a family history of stroke.  Having a history of blood clots, stroke, warning stroke (transient ischemic attack, TIA), or heart attack.  Sickle cell disease. HOME CARE  Take all medicines exactly as told by your doctor. Understand all your medicine instructions.  You may need to take a medicine to thin your blood, like aspirin or warfarin. Take warfarin exactly as told.  Taking too much or too little warfarin is dangerous. Get regular blood tests as told, including the PT and INR tests. The test results help your doctor adjust your dose of warfarin. Your PT and INR levels must be done as often as told by your doctor.  Food can cause problems with warfarin and affect the results of your blood tests. This is true for foods high in vitamin K, such as spinach, kale, broccoli, cabbage, collard and turnip greens, Brussels sprouts, peas, cauliflower, seaweed, and parsley, as well as beef and pork liver, green tea,  and soybean oil. Eat the same amount of food high in vitamin K. Avoid major changes in your diet. Tell your doctor before changing your diet. Talk to a food specialist (dietitian) if you have questions.  Many medicines can cause problems with warfarin and affect your PT and INR test results. Tell your doctor about all medicines you take. This includes vitamins and dietary pills (supplements). Be careful with aspirin and medicines that relieve redness, soreness, and puffiness (inflammation). Do not take or stop medicines unless your doctor tells you to.  Warfarin can cause a lot of bruising or bleeding. Hold pressure over cuts for longer than  normal. Talk to your doctor about other side effects of warfarin.  Avoid sports or activities that may cause injury or bleeding.  Be careful when you shave, floss your teeth, or use sharp objects.  Avoid alcoholic drinks or drink very little alcohol while taking warfarin. Tell your doctor if you change how much alcohol you drink.  Tell your dentist and other doctors that you take warfarin before procedures.  If you are able to swallow, eat healthy foods. Eat 5 or more servings of fruits and vegetables a day. Eat soft foods, pureed foods, or eat small bites of food so you do not choke.  Follow your diet program as told, if you are given one.  Keep a healthy weight.  Stay active. Try to get at least 30 minutes of activity on most or all days.  Do not smoke.  Limit how much alcohol you drink even if you are not taking warfarin. Moderate alcohol use is:  No more than 2 drinks each day for men.  No more than 1 drink each day for women who are not pregnant.  Keep your home safe so you do not fall. Try:  Putting grab bars in the bedroom and bathroom.  Raising toilet seats.  Putting a seat in the shower.  Go to therapy sessions (physical, occupational, and speech) as told by your doctor.  Use a walker or cane at all times if told to do so.  Keep all doctor visits as told. GET HELP IF:  Your personality changes.  You have trouble swallowing.  You are seeing two of everything.  You are dizzy.  You have a fever.  Your skin starts to break down. GET HELP RIGHT AWAY IF:  The symptoms below may be a sign of an emergency. Do not wait to see if the symptoms go away. Call for help (911 in [place] Do not drive yourself to the hospital.  You have sudden weakness or numbness on the face, arm, or leg (especially on one side of the body).  You have sudden trouble walking or moving your arms or legs.  You have sudden confusion.  You have trouble talking or understanding.  You  have sudden trouble seeing in one or both eyes.  You lose your balance or your movements are not smooth.  You have a sudden, severe headache with no known cause.  You have new chest pain or you feel your heart beating in an unsteady way.  You are partly or totally unaware of what is going on around you. Document Released: 11/15/2011 Document Revised: 04/12/2014 Document Reviewed: 07/06/2012 [facility] Patient Information 2015 [place], [place]. This information is not intended to replace advice given to you by your health care provider. Make sure you discuss any questions you have with your health care provider.  Stroke Prevention Some health problems and behaviors may make  it more likely for you to have a stroke. Below are ways to lessen your risk of having a stroke.   Be active for at least 30 minutes on most or all days.  Do not smoke. Try not to be around others who smoke.  Do not drink too much alcohol.  Do not have more than 2 drinks a day if you are a man.  Do not have more than 1 drink a day if you are a woman and are not pregnant.  Eat healthy foods, such as fruits and vegetables. If you were put on a specific diet, follow the diet as told.  Keep your cholesterol levels under control through diet and medicines. Look for foods that are low in saturated fat, trans fat, cholesterol, and are high in fiber.  If you have diabetes, follow all diet plans and take your medicine as told.  If you have high blood pressure (hypertension), follow all diet plans and take your medicine as told.  Keep a healthy weight. Eat foods that are low in calories, salt, saturated fat, trans fat, and cholesterol.  Do not take drugs.  Avoid birth control pills, if this applies. Talk to your doctor about the risks of taking birth control pills.  Talk to your doctor if you have sleep problems (sleep apnea).  Take all medicine as told by your doctor.  You may be told to take aspirin or blood thinner  medicine. Take this medicine as told by your doctor.  Understand your medicine instructions.  Make sure any other conditions you have are being taken care of. GET HELP RIGHT AWAY IF:  You suddenly lose feeling (you feel numb) or have weakness in your face, arm, or leg.  Your face or eyelid hangs down to one side.  You suddenly feel confused.  You have trouble talking (aphasia) or understanding what people are saying.  You suddenly have trouble seeing in one or both eyes.  You suddenly have trouble walking.  You are dizzy.  You lose your balance or your movements are clumsy (uncoordinated).  You suddenly have a very bad headache and you do not know the cause.  You have new chest pain.  Your heart feels like it is fluttering or skipping a beat (irregular heartbeat). Do not wait to see if the symptoms above go away. Get help right away. Call your local emergency services (911 in [place] Do not drive yourself to the hospital. Document Released: 05/27/2012 Document Revised: 04/12/2014 Document Reviewed: 05/29/2013 [facility] Patient Information 2015 [place], [place]. This information is not intended to replace advice given to you by your health care provider. Make sure you discuss any questions you have with your health care provider.   Cardiac Diet This diet can help prevent heart disease and stroke. Many factors influence your heart health, including eating and exercise habits. Coronary risk rises a lot with abnormal blood fat (lipid) levels. Cardiac meal planning includes limiting unhealthy fats, increasing healthy fats, and making other small dietary changes. General guidelines are as follows:  Adjust calorie intake to reach and maintain desirable body weight.  Limit total fat intake to less than 30% of total calories. Saturated fat should be less than 7% of calories.  Saturated fats are found in animal products and in some vegetable products. Saturated vegetable fats are found in  coconut oil, cocoa butter, palm oil, and palm kernel oil. Read labels carefully to avoid these products as much as possible. Use butter in moderation. Choose tub margarines  and oils that have 2 grams of fat or less. Good cooking oils are canola and olive oils.  Practice low-fat cooking techniques. Do not fry food. Instead, broil, bake, boil, steam, grill, roast on a rack, stir-fry, or microwave it. Other fat reducing suggestions include:  Remove the skin from poultry.  Remove all visible fat from meats.  Skim the fat off stews, soups, and gravies before serving them.  Steam vegetables in water or broth instead of sauting them in fat.  Avoid foods with trans fat (or hydrogenated oils), such as commercially fried foods and commercially baked goods. Commercial shortening and deep-frying fats will contain trans fat.  Increase intake of fruits, vegetables, whole grains, and legumes to replace foods high in fat.  Increase consumption of nuts, legumes, and seeds to at least 4 servings weekly. One serving of a legume equals  cup, and 1 serving of nuts or seeds equals  cup.  Choose whole grains more often. Have 3 servings per day (a serving is 1 ounce [oz]).  Eat 4 to 5 servings of vegetables per day. A serving of vegetables is 1 cup of raw leafy vegetables;  cup of raw or cooked cut-up vegetables;  cup of vegetable juice.  Eat 4 to 5 servings of fruit per day. A serving of fruit is 1 medium whole fruit;  cup of dried fruit;  cup of fresh, frozen, or canned fruit;  cup of 100% fruit juice.  Increase your intake of dietary fiber to 20 to 30 grams per day. Insoluble fiber may help lower your risk of heart disease and may help curb your appetite.  Soluble fiber binds cholesterol to be removed from the blood. Foods high in soluble fiber are dried beans, citrus fruits, oats, apples, bananas, broccoli, Brussels sprouts, and eggplant.  Try to include foods fortified with plant sterols or stanols,  such as yogurt, breads, juices, or margarines. Choose several fortified foods to achieve a daily intake of 2 to 3 grams of plant sterols or stanols.  Foods with omega-3 fats can help reduce your risk of heart disease. Aim to have a 3.5 oz portion of fatty fish twice per week, such as salmon, mackerel, albacore tuna, sardines, lake trout, or herring. If you wish to take a fish oil supplement, choose one that contains 1 gram of both DHA and EPA.  Limit processed meats to 2 servings (3 oz portion) weekly.  Limit the sodium in your diet to 1500 milligrams (mg) per day. If you have high blood pressure, talk to a registered dietitian about a DASH (Dietary Approaches to Stop Hypertension) eating plan.  Limit sweets and beverages with added sugar, such as soda, to no more than 5 servings per week. One serving is:   1 tablespoon sugar.  1 tablespoon jelly or jam.   cup sorbet.  1 cup lemonade.   cup regular soda. CHOOSING FOODS Starches  Allowed: Breads: All kinds (wheat, rye, raisin, white, oatmeal, [place], [place], and English muffin bread). Low-fat rolls: English muffins, frankfurter and hamburger buns, bagels, pita bread, tortillas (not fried). Pancakes, waffles, biscuits, and muffins made with recommended oil.  Avoid: Products made with saturated or trans fats, oils, or whole milk products. Butter rolls, cheese breads, croissants. Commercial doughnuts, muffins, sweet rolls, biscuits, waffles, pancakes, store-bought mixes. Crackers  Allowed: Low-fat crackers and snacks: Animal, graham, rye, saltine (with recommended oil, no lard), oyster, and matzo crackers. Bread sticks, melba toast, rusks, flatbread, pretzels, and light popcorn.  Avoid: High-fat crackers: cheese crackers,  butter crackers, and those made with coconut, palm oil, or trans fat (hydrogenated oils). Buttered popcorn. Cereals  Allowed: Hot or cold whole-grain cereals.  Avoid: Cereals containing coconut, hydrogenated  vegetable fat, or animal fat. Potatoes / Pasta / Rice  Allowed: All kinds of potatoes, rice, and pasta (such as macaroni, spaghetti, and noodles).  Avoid: Pasta or rice prepared with cream sauce or high-fat cheese. Chow mein noodles, [place] fries. Vegetables  Allowed: All vegetables and vegetable juices.  Avoid: Fried vegetables. Vegetables in cream, butter, or high-fat cheese sauces. Limit coconut. Fruit in cream or custard. Protein  Allowed: Limit your intake of meat, seafood, and poultry to no more than 6 oz (cooked weight) per day. All lean, well-trimmed beef, veal, pork, and lamb. All chicken and [place] without skin. All fish and shellfish. Wild game: wild duck, rabbit, pheasant, and venison. Egg whites or low-cholesterol egg substitutes may be used as desired. Meatless dishes: recipes with dried beans, peas, lentils, and tofu (soybean curd). Seeds and nuts: all seeds and most nuts.  Avoid: Prime grade and other heavily marbled and fatty meats, such as short ribs, spare ribs, rib eye roast or steak, frankfurters, sausage, bacon, and high-fat luncheon meats, mutton. Caviar. Commercially fried fish. Domestic duck, goose, venison sausage. Organ meats: liver, gizzard, heart, chitterlings, brains, kidney, sweetbreads. Dairy  Allowed: Low-fat cheeses: nonfat or low-fat cottage cheese (1% or 2% fat), cheeses made with part skim milk, such as mozzarella, farmers, string, or ricotta. (Cheeses should be labeled no more than 2 to 6 grams fat per oz.). Skim (or 1%) milk: liquid, powdered, or evaporated. Buttermilk made with low-fat milk. Drinks made with skim or low-fat milk or cocoa. Chocolate milk or cocoa made with skim or low-fat (1%) milk. Nonfat or low-fat yogurt.  Avoid: Whole milk cheeses, including colby, cheddar, mu[place], Monterey Jack, [place], [place], [place], American, Swiss, and blue. Creamed cottage cheese, cream cheese. Whole milk and whole milk products, including buttermilk or yogurt  made from whole milk, drinks made from whole milk. Condensed milk, evaporated whole milk, and 2% milk. Soups and Combination Foods  Allowed: Low-fat low-sodium soups: broth, dehydrated soups, homemade broth, soups with the fat removed, homemade cream soups made with skim or low-fat milk. Low-fat spaghetti, lasagna, chili, and Spanish rice if low-fat ingredients and low-fat cooking techniques are used.  Avoid: Cream soups made with whole milk, cream, or high-fat cheese. All other soups. Desserts and Sweets  Allowed: Sherbet, fruit ices, gelatins, meringues, and angel food cake. Homemade desserts with recommended fats, oils, and milk products. Jam, jelly, honey, marmalade, sugars, and syrups. Pure sugar candy, such as gum drops, hard candy, jelly beans, marshmallows, mints, and small amounts of dark chocolate.  Avoid: Commercially prepared cakes, pies, cookies, frosting, pudding, or mixes for these products. Desserts containing whole milk products, chocolate, coconut, lard, palm oil, or palm kernel oil. Ice cream or ice cream drinks. Candy that contains chocolate, coconut, butter, hydrogenated fat, or unknown ingredients. Buttered syrups. Fats and Oils  Allowed: Vegetable oils: safflower, sunflower, corn, soybean, cottonseed, sesame, canola, olive, or peanut. Non-hydrogenated margarines. Salad dressing or mayonnaise: homemade or commercial, made with a recommended oil. Low or nonfat salad dressing or mayonnaise.  Limit added fats and oils to 6 to 8 tsp per day (includes fats used in cooking, baking, salads, and spreads on bread). Remember to count the "hidden fats" in foods.  Avoid: Solid fats and shortenings: butter, lard, salt pork, bacon drippings. Gravy containing meat fat, shortening, or suet. Cocoa  butter, coconut. Coconut oil, palm oil, palm kernel oil, or hydrogenated oils: these ingredients are often used in bakery products, nondairy creamers, whipped toppings, candy, and commercially fried  foods. Read labels carefully. Salad dressings made of unknown oils, sour cream, or cheese, such as blue cheese and Roquefort. Cream, all kinds: half-and-half, light, heavy, or whipping. Sour cream or cream cheese (even if "light" or low-fat). Nondairy cream substitutes: coffee creamers and sour cream substitutes made with palm, palm kernel, hydrogenated oils, or coconut oil. Beverages  Allowed: Coffee (regular or decaffeinated), tea. Diet carbonated beverages, mineral water. Alcohol: Check with your caregiver. Moderation is recommended.  Avoid: Whole milk, regular sodas, and juice drinks with added sugar. Condiments  Allowed: All seasonings and condiments. Cocoa powder. "Cream" sauces made with recommended ingredients.  Avoid: Carob powder made with hydrogenated fats. SAMPLE MENU Breakfast   cup orange juice   cup oatmeal  1 slice toast  1 tsp margarine  1 cup skim milk Lunch  [place] sandwich with 2 oz [place], 2 slices bread  Lettuce and tomato slices  Fresh fruit  Carrot sticks  Coffee or tea Snack  Fresh fruit or low-fat crackers Dinner  3 oz lean ground beef  1 baked potato  1 tsp margarine   cup asparagus  Lettuce salad  1 tbs non-creamy dressing   cup peach slices  1 cup skim milk Document Released: 09/04/2008 Document Revised: 05/27/2012 Document Reviewed: 01/26/2014 [employer] Patient Information 2015 [place], [place]. This information is not intended to replace advice given to you by your health care provider. Make sure you discuss any questions you have with your health care provider.

## 2014-10-25 NOTE — Progress Notes (Signed)
CSW (Clinical Education officer, [employer]) provided pt son with dc packet. Pt son to provide transport. Per facility request, pt will be discharged at 2pm or later. Pt nurse aware. CSW signing off.  [place], [place]

## 2014-10-25 NOTE — Discharge Summary (Signed)
Physician Discharge Summary  Heather Alvarado HQI:[id number] DOB: 02-Dec-1923 DOA: 10/21/2014  PCP: Precious Reel, MD  Admit date: 10/21/2014 Discharge date: 10/25/2014  Time spent: 40 minutes  Recommendations for Outpatient Follow-up:  1. Follow-up with primary care physician in one week. 2. Follow-up with [facility] neurology in 1-2 weeks as outpatient. 3. Patient being discharged to SNF for further rehabilitation.  Discharge Diagnoses:  Active Problems:   CAD (coronary artery disease)   Factor V Leiden   Hyperlipidemia   Hypertension   CVA (cerebral infarction)   Discharge Condition: stable  Diet recommendation: heart healthy diet  Filed Weights   10/23/14 0625 10/24/14 0400 10/25/14 0400  Weight: 57.017 kg (125 lb 11.2 oz) 58.695 kg (129 lb 6.4 oz) 57.97 kg (127 lb 12.8 oz)    History of present illness:  Heather Alvarado is a 80 y.o. female, with past medical history factor V Leyden deficiency, coronary artery disease, hyperlipidemia, hypertension, presents with complaints of feeling wobbly, unsteady gait, she describes it as feeling drunk, reports she saw her PCP, who scheduled her for CT head and carotid Doppler, she underwent a head CT today and was told to go to ED acute right cerebellar infarct, patient is on warfarin where her INR is 2, he was given 325 mg of aspirin in ED. She reports her symptoms has been going on for a week.  Hospital Course:   CVA (cerebral infarction) Infarct likely occurred 1 week PTA, with onset of symptoms. MRI of brain shows acute to subacute RIGHT posterior inferior cerebellar artery territory infarct. Head CT shows subacute infarct involving nearly the entire right cerebellar hemisphere and a small portion of the cerebellar vermis. This infarct is not associated with visible acute hemorrhage or significant mass effect. 2D Echo, carotid doppler, and carotid duplex ordered.  Will try to achieve INR between 2.5 and 3.0. Records from Dr. Virgina Jock  showing INR 1.7-1.9. Recommend INR to be in the high end of normal.  Factor V Leiden Chronic problem. Managed on Coumadin. INR at admission was 2.0. Will continue to monitor INR.  Will try to achieve INR at higher end of 2.0-3.0 range.  INR at the time of discharge is 2.51.  CAD (coronary artery disease) Fasting lipid panel WNL. Carotid doppler and duplex ordered.   Hyperlipidemia Chronic problem. Well controlled. Fasting lipid panel WNL. Continue Simvastatin and Fish oil.   Hypertension Chronic problem. Last BP 161/54. Continue Imdur, HCTZ, and Olmesartan.    Procedures:  Carotid duplex done on 10/22/2014 showed vertebral arteries patent with antegrade flow, 40-59% stenosis involving the Right ICA, 1-39% stenosis involving the left ICA.  2-D echo done on 10/22/2014 showed LVEF of 60-65% with grade 1 diastolic dysfunction. Mild mitral regurgitation.  Consultations:  neurology  Discharge Exam: Filed Vitals:   10/25/14 0800  BP: 173/67  Pulse: 75  Temp: 97.6 F (36.4 C)  Resp: 18   General: Alert and awake, oriented x3, not in any acute distress. HEENT: anicteric sclera, pupils reactive to light and accommodation, EOMI CVS: S1-S2 clear, no murmur rubs or gallops Chest: clear to auscultation bilaterally, no wheezing, rales or rhonchi Abdomen: soft nontender, nondistended, normal bowel sounds, no organomegaly Extremities: no cyanosis, clubbing or edema noted bilaterally Neuro: Cranial nerves II-XII intact, no focal neurological deficits  Discharge Instructions You were cared for by a hospitalist during your hospital stay. If you have any questions about your discharge medications or the care you received while you were in the hospital after you are discharged, you  can call the unit and asked to speak with the hospitalist on call if the hospitalist that took care of you is not available. Once you are discharged, your primary care physician will handle any  further medical issues. Please note that NO REFILLS for any discharge medications will be authorized once you are discharged, as it is imperative that you return to your primary care physician (or establish a relationship with a primary care physician if you do not have one) for your aftercare needs so that they can reassess your need for medications and monitor your lab values.  Discharge Instructions    Ambulatory referral to Neurology    Complete by:  As directed   Pt will follow up with Dr. Erlinda Hong at [facility] in about 2 months. Thanks     Diet - low sodium heart healthy    Complete by:  As directed      Increase activity slowly    Complete by:  As directed           Current Discharge Medication List    CONTINUE these medications which have CHANGED   Details  traMADol (ULTRAM) 50 MG tablet Take 1 tablet (50 mg total) by mouth every 6 (six) hours as needed. Qty: 10 tablet, Refills: 0      CONTINUE these medications which have NOT CHANGED   Details  calcium-vitamin D (OSCAL WITH D) 500-200 MG-UNIT per tablet Take 1 tablet by mouth 2 (two) times daily.     Cholecalciferol (VITAMIN D) 2000 UNITS CAPS Take 1 capsule by mouth daily.      hydrochlorothiazide (HYDRODIURIL) 25 MG tablet Take 25 mg by mouth daily.      hydrocortisone (ANUSOL-HC) 25 MG suppository     meclizine (ANTIVERT) 25 MG tablet Take 25 mg by mouth 2 (two) times daily as needed for dizziness.  Refills: 1    Misc Natural Products (OSTEO BI-FLEX ADV DOUBLE ST) CAPS Take 2 capsules by mouth daily.      Multiple Vitamins-Minerals (MULTIVITAMIN WITH MINERALS) tablet Take 1 tablet by mouth daily.      nitroGLYCERIN (NITROSTAT) 0.4 MG SL tablet Place 1 tablet (0.4 mg total) under the tongue every 5 (five) minutes as needed for chest pain. Qty: 90 tablet, Refills: 3    olmesartan (BENICAR) 20 MG tablet Take 20 mg by mouth daily.      Omega-3 Fatty Acids (FISH OIL) 1000 MG CAPS Take 1,000 mg by mouth daily.     ondansetron  (ZOFRAN) 4 MG tablet     pantoprazole (PROTONIX) 40 MG tablet Take 40 mg by mouth daily.      potassium chloride SA (K-DUR,KLOR-CON) 20 MEQ tablet Take 20 mEq by mouth daily.      simvastatin (ZOCOR) 40 MG tablet Take 40 mg by mouth at bedtime.      warfarin (COUMADIN) 5 MG tablet Take 2.5-5 mg by mouth See admin instructions. 2.5 mg on Sunday Tuesday Thursday Saturday and 5 mg on all other days      STOP taking these medications     hydrocortisone-pramoxine (ANALPRAM-HC) 2.5-1 % rectal cream        Allergies  Allergen Reactions  . Ivp Dye [Iodinated Diagnostic Agents] Hives   Follow-up Information    Follow up with Precious Reel, MD In 1 week.   Specialty:  Internal Medicine   Contact information:   [place] [id number] [phone]       Follow up with Xu,Jindong, MD In 1  week.   Specialty:  Neurology   Why:  Stroke follow up   Contact information:   [address] [place] [address] [phone]        The results of significant diagnostics from this hospitalization (including imaging, microbiology, ancillary and laboratory) are listed below for reference.    Significant Diagnostic Studies: Dg Chest 2 View  10/21/2014   CLINICAL DATA:  Weakness and dizziness  EXAM: CHEST  2 VIEW  COMPARISON:  02/08/2011  FINDINGS: Cardiac shadow is stable. Postoperative changes are again seen. The right hemidiaphragm is elevated. No focal infiltrate or sizable effusion is seen. Aortic calcifications are noted. No acute bony abnormality is seen  IMPRESSION: No acute abnormality noted.   Electronically Signed   By: Inez Catalina M.D.   On: 10/21/2014 13:31   Ct Head Wo Contrast  10/21/2014   CLINICAL DATA:  Dizziness, nausea and vomiting.  EXAM: CT HEAD WITHOUT CONTRAST  TECHNIQUE: Contiguous axial images were obtained from the base of the skull through the vertex without intravenous contrast.  COMPARISON:  None.  FINDINGS: Large infarct noted of  the right cerebellar hemisphere which involves a small portion of the vermis. Based on density, this is not very acute and likely greater than 38 hr old. No associated overt hemorrhage or significant mass effect. The rest the brain shows advanced small vessel ischemic changes and cortical atrophy. No hydrocephalus identified. The skull is unremarkable.  IMPRESSION: Subacute infarct involving nearly the entire right cerebellar hemisphere and a small portion of the cerebellar vermis. This infarct is not associated with visible acute hemorrhage or significant mass effect.  Critical Value/emergent results were called by telephone at the time of interpretation on 10/21/2014 at 1:16 pm to Dr. Franchot Mimes , who verbally acknowledged these results.   Electronically Signed   By: Aletta Edouard M.D.   On: 10/21/2014 13:38   Mr Jodene Nam Head Wo Contrast  10/22/2014   CLINICAL DATA:  Gait imbalance for 1 week. On Coumadin for factor 5 Leiden deficiency. Cerebellar stroke seen on CT, here for further evaluation.  EXAM: MRI HEAD WITHOUT CONTRAST  MRA HEAD WITHOUT CONTRAST  TECHNIQUE: Multiplanar, multiecho pulse sequences of the brain and surrounding structures were obtained without intravenous contrast. Angiographic images of the head were obtained using MRA technique without contrast.  COMPARISON:  CT of the head November 12th 2015 at 1249 hr  FINDINGS: MRI HEAD FINDINGS  RIGHT inferior cerebellar reduced diffusion with corresponding nearly normalized ADC values and, bright T2 signal. Local mass effect though, fourth ventricle remains patent.  Punctate susceptibility artifact in LEFT occipital lobe. Moderate to severe ventriculomegaly, likely on the basis of global parenchymal brain volume loss as there is overall commensurate enlargement of cerebral sulci and cerebellar folia, similar. Patchy to confluent supratentorial white matter FLAIR T2 hyperintensities suggest chronic small vessel ischemic disease.  No abnormal  extra-axial fluid collections. Ocular globes and orbital contents are unremarkable. Trace LEFT maxillary sinus mucosal thickening without air-fluid levels. Small LEFT mastoid effusion. RIGHT mastoid air cells are well aerated. 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 No abnormal sellar expansion. Low signal about the odontoid process likely reflects cerumen. Patient is edentulous.  MRA HEAD FINDINGS  Anterior circulation: Normal flow related enhancement of the cervical, petrous, cavernous and supra clinoid internal carotid arteries though, there is slight irregularity of the vessels through these segments corresponding to calcific atherosclerosis seen on prior CT. Mildly motion degraded evaluation with normal flow related enhancement of the  anterior middle cerebral arteries.  Posterior circulation: LEFT vertebral artery is dominant. Normal flow related enhancement vertebral arteries, basilar artery main branch vessels. Normal pleural enhancement of posterior cerebral arteries.  No large vessel occlusion, hemodynamically significant stenosis, aneurysm, suspicious luminal irregularity.  IMPRESSION: MRI HEAD: Acute to subacute RIGHT posterior inferior cerebellar artery territory infarct.  Moderate to severe global parenchymal brain volume loss. Moderate white matter changes suggest chronic small vessel ischemic disease.  Solitary LEFT occipital lobe micro hemorrhage, nonspecific.  MRA HEAD: No acute vascular process nor hemodynamically significant stenosis.   Electronically Signed   By: Elon Alas   On: 10/22/2014 04:24   Mr Brain Wo Contrast  10/22/2014   CLINICAL DATA:  Gait imbalance for 1 week. On Coumadin for factor 5 Leiden deficiency. Cerebellar stroke seen on CT, here for further evaluation.  EXAM: MRI HEAD WITHOUT CONTRAST  MRA HEAD WITHOUT CONTRAST  TECHNIQUE: Multiplanar, multiecho pulse sequences of the brain and surrounding structures were obtained without intravenous  contrast. Angiographic images of the head were obtained using MRA technique without contrast.  COMPARISON:  CT of the head November 12th 2015 at 1249 hr  FINDINGS: MRI HEAD FINDINGS  RIGHT inferior cerebellar reduced diffusion with corresponding nearly normalized ADC values and, bright T2 signal. Local mass effect though, fourth ventricle remains patent.  Punctate susceptibility artifact in LEFT occipital lobe. Moderate to severe ventriculomegaly, likely on the basis of global parenchymal brain volume loss as there is overall commensurate enlargement of cerebral sulci and cerebellar folia, similar. Patchy to confluent supratentorial white matter FLAIR T2 hyperintensities suggest chronic small vessel ischemic disease.  No abnormal extra-axial fluid collections. Ocular globes and orbital contents are unremarkable. Trace LEFT maxillary sinus mucosal thickening without air-fluid levels. Small LEFT mastoid effusion. RIGHT mastoid air cells are well aerated. 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 No abnormal sellar expansion. Low signal about the odontoid process likely reflects cerumen. Patient is edentulous.  MRA HEAD FINDINGS  Anterior circulation: Normal flow related enhancement of the cervical, petrous, cavernous and supra clinoid internal carotid arteries though, there is slight irregularity of the vessels through these segments corresponding to calcific atherosclerosis seen on prior CT. Mildly motion degraded evaluation with normal flow related enhancement of the anterior middle cerebral arteries.  Posterior circulation: LEFT vertebral artery is dominant. Normal flow related enhancement vertebral arteries, basilar artery main branch vessels. Normal pleural enhancement of posterior cerebral arteries.  No large vessel occlusion, hemodynamically significant stenosis, aneurysm, suspicious luminal irregularity.  IMPRESSION: MRI HEAD: Acute to subacute RIGHT posterior inferior cerebellar artery  territory infarct.  Moderate to severe global parenchymal brain volume loss. Moderate white matter changes suggest chronic small vessel ischemic disease.  Solitary LEFT occipital lobe micro hemorrhage, nonspecific.  MRA HEAD: No acute vascular process nor hemodynamically significant stenosis.   Electronically Signed   By: Elon Alas   On: 10/22/2014 04:24    Microbiology: No results found for this or any previous visit (from the past 240 hour(s)).   Labs: Basic Metabolic Panel:  Recent Labs Lab 10/21/14 1121 10/24/14 0351  NA 141 140  K 4.2 3.9  CL 102 106  CO2 24 23  GLUCOSE 150* 91  BUN 28* 17  CREATININE 1.33* 1.15*  CALCIUM 10.2 9.1   Liver Function Tests: No results for input(s): AST, ALT, ALKPHOS, BILITOT, PROT, ALBUMIN in the last 168 hours.  Recent Labs Lab 10/21/14 1121  LIPASE 40   No results for input(s): AMMONIA in the last 168 hours. CBC:  Recent Labs Lab 10/21/14 1121  WBC 11.0*  NEUTROABS 6.6  HGB 14.1  HCT 42.6  MCV 90.8  PLT 246   Cardiac Enzymes: No results for input(s): CKTOTAL, CKMB, CKMBINDEX, TROPONINI in the last 168 hours. BNP: BNP (last 3 results) No results for input(s): PROBNP in the last 8760 hours. CBG: No results for input(s): GLUCAP in the last 168 hours.     Signed:  Giuseppe Duchemin A  [facility] 10/25/2014, 11:09 AM

## 2014-10-25 NOTE — Progress Notes (Signed)
Physical Therapy Treatment Patient Details Name: CANAAN PRUE MRN: [id number] DOB: 08/30/1923 Today's Date: 10/25/2014    History of Present Illness This 80 y.o. female admitted with one week h/o feeling wobbly and unsteady gait.  MRI of head reaveals acute/subacute Rt. posterior inferior cerebellar artery territory infarct.  CT of head showed subacute infarct of nearly the entire right cerebellar hemisphere.  PMH:  CAD; HTN    PT Comments    Pt believes that she will be going to SNF today, but is not sure which one she will be going to. Pt was treated for Left sided BPPV with Epley maneuver, but has suspected Right side hypofunction as evidenced by decreased gaze stability with Right eye upon testing. Pt was given exercises to treat both BPPV and gaze stability and was informed to not start the gaze stability exercises for at least 24 hours to maintain effectiveness of Epley's. Pt tolerated Epley's maneuver well with minimal dizziness. Pt exhibited decreased Left side drift during ambulation today but did have instance of loss of balance upon initial standing which she was able to self correct.    Follow Up Recommendations  SNF     Equipment Recommendations  Rolling walker with 5" wheels    Recommendations for Other Services       Precautions / Restrictions Precautions Precautions: Fall Restrictions Weight Bearing Restrictions: No    Mobility  Bed Mobility Overal bed mobility: Needs Assistance Bed Mobility: Supine to Sit;Sit to Supine     Supine to sit: HOB elevated;Supervision Sit to supine: Min guard   General bed mobility comments: Pt demonstrated ability to go supine to sit with HOB highly elevated with supervision. Pt required slight assistance bring legs up into bed when going sit to supine. Pt required asssistance moving up and adjusting once in bed.    Transfers Overall transfer level: Needs assistance Equipment used: Rolling walker (2 wheeled) Transfers: Sit  to/from Stand Sit to Stand: Min assist         General transfer comment: Pt stood from lowered bed with min assist from PT. Pt had episode of loss of balance once she stood before moving. Pt stated she was slightly dizzy upon standing but was able to correct herself from her loss of balance.   Ambulation/Gait Ambulation/Gait assistance: Min guard Ambulation Distance (Feet): 100 Feet Assistive device: Rolling walker (2 wheeled) Gait Pattern/deviations: Step-through pattern;Decreased stride length;Narrow base of support   Gait velocity interpretation: at or above normal speed for age/gender General Gait Details: Pt demonstrated decreased Left drift with ambulation. Pt did not walk as far as previous session due to complaint of dizziness that PT wanted to asses. Pt has excessive shifting to each side during stance.    Stairs            Wheelchair Mobility    Modified Rankin (Stroke Patients Only) Modified Rankin (Stroke Patients Only) Pre-Morbid Rankin Score: No symptoms Modified Rankin: Moderately severe disability     Balance Overall balance assessment: Needs assistance Sitting-balance support: Feet supported;No upper extremity supported Sitting balance-Leahy Scale: Good Sitting balance - Comments: Pt able to scoot forward on bed to get feet on floor.   Standing balance support: During functional activity;Bilateral upper extremity supported Standing balance-Leahy Scale: Poor Standing balance comment: Pt stood from lowered bed and had loss of balance episode upon standing. Pt able to self correct.                    Cognition Arousal/Alertness:  Awake/alert Behavior During Therapy: WFL for tasks assessed/performed Overall Cognitive Status: Within Functional Limits for tasks assessed                      Exercises      General Comments General comments (skin integrity, edema, etc.): Pt treated for Left BPPV with Epley's maneuver.       Pertinent  Vitals/Pain Pain Assessment: No/denies pain    Home Living                      Prior Function            PT Goals (current goals can now be found in the care plan section) Progress towards PT goals: Progressing toward goals    Frequency  Min 4X/week    PT Plan Current plan remains appropriate    Co-evaluation             End of Session Equipment Utilized During Treatment: Gait belt Activity Tolerance: Patient tolerated treatment well Patient left: in bed;with call bell/phone within reach;with family/visitor present     Time: 7893-8101 PT Time Calculation (min) (ACUTE ONLY): 21 min  Charges:  $[place] Rep Proc: 8-22 mins                    G CodesJearld Shines, SPT 10/25/2014, 12:59 PM   Jearld Shines, [place]  [facility] [phone]

## 2014-10-25 NOTE — Care Management Note (Signed)
    Page 1 of 1   10/25/2014     11:28:48 AM CARE MANAGEMENT NOTE 10/25/2014  Patient:  Heather Alvarado, Heather Alvarado   Account Number:  [id number]  Date Initiated:  10/25/2014  Documentation initiated by:  GRAVES-BIGELOW,Derricka Mertz  Subjective/Objective Assessment:   Pt admitted for stroke. Plan for d/c to SNF today.     Action/Plan:   CSW assisting with disposition needs.   Anticipated DC Date:  10/25/2014   Anticipated DC Plan:  [place]  CM consult      Choice offered to / List presented to:             Status of service:  Completed, signed off Medicare Important Message given?  YES (If response is "NO", the following Medicare IM given date fields will be blank) Date Medicare IM given:  10/25/2014 Medicare IM given by:  GRAVES-BIGELOW,Shatarra Wehling Date Additional Medicare IM given:   Additional Medicare IM given by:    Discharge Disposition:  [place]  Per UR Regulation:  Reviewed for med. necessity/level of care/duration of stay  If discussed at [place] of Stay Meetings, dates discussed:    Comments:

## 2014-10-25 NOTE — Plan of Care (Signed)
Problem: Progression Outcomes Goal: Rehab Team goals identified Outcome: Completed/Met Date Met:  10/25/14 Goal: Other Progression Outcomes Outcome: Completed/Met Date Met:  10/25/14  Problem: Discharge/Transitional Outcomes Goal: Barriers To Progression Addressed/Resolved Outcome: Completed/Met Date Met:  10/25/14 Goal: Educational Plan Complete Outcome: Completed/Met Date Met:  10/25/14 Goal: Hemodynamically stable Outcome: Completed/Met Date Met:  10/25/14 Goal: If vent dependent, trach in place Outcome: Not Applicable Date Met:  10/25/14 Goal: Independent mobility/functioning independent or with min Independent mobility/functioning independently or with minimal assistance  Outcome: Completed/Met Date Met:  10/25/14 Goal: Tolerating diet/TF at goal rate-PEG if inadequate intake Outcome: Completed/Met Date Met:  10/25/14 Goal: INR monitor plan established Outcome: Completed/Met Date Met:  10/25/14 Goal: Family and patient agree upon discharge plan Outcome: Completed/Met Date Met:  10/25/14 Goal: Family/Caregiver willing and able to support plan Family/Caregiver willing and able to support plan for self-management after transition home  Outcome: Completed/Met Date Met:  10/25/14 Goal: PCP appointment made and transportation plan in place Outcome: Completed/Met Date Met:  10/25/14 Goal: Ability to attain medications upon leaving hospital Outcome: Completed/Met Date Met:  10/25/14 Goal: Other Discharge Outcomes/Goals Outcome: Completed/Met Date Met:  10/25/14     

## 2014-10-26 ENCOUNTER — Non-Acute Institutional Stay (SKILLED_NURSING_FACILITY): Payer: Medicare Other | Admitting: Registered Nurse

## 2014-10-26 ENCOUNTER — Encounter: Payer: Self-pay | Admitting: Registered Nurse

## 2014-10-26 DIAGNOSIS — R2689 Other abnormalities of gait and mobility: Secondary | ICD-10-CM | POA: Diagnosis not present

## 2014-10-26 DIAGNOSIS — E559 Vitamin D deficiency, unspecified: Secondary | ICD-10-CM

## 2014-10-26 DIAGNOSIS — I10 Essential (primary) hypertension: Secondary | ICD-10-CM

## 2014-10-26 DIAGNOSIS — D6851 Activated protein C resistance: Secondary | ICD-10-CM | POA: Diagnosis not present

## 2014-10-26 DIAGNOSIS — I25119 Atherosclerotic heart disease of native coronary artery with unspecified angina pectoris: Secondary | ICD-10-CM | POA: Diagnosis not present

## 2014-10-26 DIAGNOSIS — I69398 Other sequelae of cerebral infarction: Secondary | ICD-10-CM

## 2014-10-26 DIAGNOSIS — K219 Gastro-esophageal reflux disease without esophagitis: Secondary | ICD-10-CM

## 2014-10-26 DIAGNOSIS — E785 Hyperlipidemia, unspecified: Secondary | ICD-10-CM | POA: Diagnosis not present

## 2014-10-26 NOTE — Progress Notes (Signed)
Patient ID: Heather Alvarado, female   DOB: Mar 22, 1923, 80 y.o.   MRN: [id number]   Place of Service: [facility]  Allergies  Allergen Reactions  . Ivp Dye [Iodinated Diagnostic Agents] Hives    Code Status: Full Code  Goals of Care: Longevity/STR  Chief Complaint  Patient presents with  . Hospitalization Follow-up    HPI 80 y.o. female with PMH of Factor V Leiden deficiency, HTN, GERD, CAD, HLD, vitamin D deficiency among others is being seen for post hospital follow-up. She is here for STR after hospital admission from 10/21/14 to 10/25/14 for acute to subacute right posterior inferior cerebellar artery territory infarct. No complaints verbalized from patient. Family is at bedside.   Review of Systems Constitutional: Negative for fever and chills HENT: Negative for congestion, and sore throat Eyes: Negative for eye pain and visual disturbance  Cardiovascular: Negative for chest pain, palpitations, and leg swelling Respiratory: Negative cough, shortness of breath, and wheezing.  Gastrointestinal: Negative for nausea and vomiting.  Genitourinary: Negative for  dysuria Musculoskeletal: Negative for back pain, joint pain, and joint swelling.  Neurological: Positive for dizziness occasionally. Negative for headache and weakness Skin: Negative for rash and wound.   Psychiatric: Negative for depression  Past Medical History  Diagnosis Date  . CAD (coronary artery disease)   . Factor V Leiden   . GERD (gastroesophageal reflux disease)   . Multinodular goiter   . Internal hemorrhoids   . Hyperlipidemia   . Hypertension   . Nephrolithiasis   . Osteopenia   . Phlebitis   . PVD (peripheral vascular disease)   . Varicose vein   . Vitamin D deficiency   . DDD (degenerative disc disease), lumbar   . Blood transfusion without reported diagnosis   . Clotting disorder   . Stroke 10/2014    Past Surgical History  Procedure Laterality Date  . Total abdominal  hysterectomy    . Bladder repair    . Coronary artery bypass graft    . Coronary artery bypass graft  2004    lima-lad,rima-rca,lradial-om1&2  . Breast biopsy        Medication List       This list is accurate as of: 10/26/14 11:45 PM.  Always use your most recent med list.               calcium-vitamin D 500-200 MG-UNIT per tablet  Commonly known as:  OSCAL WITH D  Take 1 tablet by mouth 2 (two) times daily.     Fish Oil 1000 MG Caps  Take 1,000 mg by mouth daily.     hydrochlorothiazide 25 MG tablet  Commonly known as:  HYDRODIURIL  Take 25 mg by mouth daily.     hydrocortisone 25 MG suppository  Commonly known as:  ANUSOL-HC     meclizine 25 MG tablet  Commonly known as:  ANTIVERT  Take 25 mg by mouth 2 (two) times daily as needed for dizziness.     multivitamin with minerals tablet  Take 1 tablet by mouth daily.     nitroGLYCERIN 0.4 MG SL tablet  Commonly known as:  NITROSTAT  Place 1 tablet (0.4 mg total) under the tongue every 5 (five) minutes as needed for chest pain.     olmesartan 20 MG tablet  Commonly known as:  BENICAR  Take 20 mg by mouth daily.     ondansetron 4 MG tablet  Commonly known as:  ZOFRAN     OSTEO BI-FLEX  ADV DOUBLE ST Caps  Take 2 capsules by mouth daily.     pantoprazole 40 MG tablet  Commonly known as:  PROTONIX  Take 40 mg by mouth daily.     potassium chloride SA 20 MEQ tablet  Commonly known as:  K-DUR,KLOR-CON  Take 20 mEq by mouth daily.     simvastatin 40 MG tablet  Commonly known as:  ZOCOR  Take 40 mg by mouth at bedtime.     traMADol 50 MG tablet  Commonly known as:  ULTRAM  Take 1 tablet (50 mg total) by mouth every 6 (six) hours as needed.     Vitamin D 2000 UNITS Caps  Take 1 capsule by mouth daily.     warfarin 5 MG tablet  Commonly known as:  COUMADIN  Take 2.5-5 mg by mouth See admin instructions. 2.5 mg on Sunday Tuesday Thursday Saturday and 5 mg on all other days        Physical Exam Filed  Vitals:   10/26/14 2343  BP: 150/89  Pulse: 75  Temp: 98 F (36.7 C)  Resp: 17   Constitutional: WDWN elderly female in no acute distress. Conversant and pleasant. HEENT: Normocephalic and atraumatic. PERRL. EOM intact. No icterus. Oral mucosa moist. Posterior pharynx clear of any exudate or lesions.  Neck: Supple and nontender. No lymphadenopathy, masses, or thyromegaly. No JVD or carotid bruits. Cardiac: Normal S1, S2. RRR without appreciable murmurs, rubs, or gallops. Distal pulses intact. No dependent edema.  Lungs: No respiratory distress. Breath sounds clear bilaterally without rales, rhonchi, or wheezes. Abdomen: Audible bowel sounds in all quadrants. Soft, nontender, nondistended. No palpable mass.  Musculoskeletal: Able to move all four extremities. No joint erythema or tenderness. Skin: Warm and dry. No rash noted. No erythema.  Neurological: Alert and oriented to person and time. Psychiatric: Judgment and insight adequate. Appropriate mood and affect.   Labs Reviewed CBC Latest Ref Rng 10/21/2014 06/28/2011 02/08/2011  WBC 4.0 - 10.5 K/uL 11.0(H) - -  Hemoglobin 12.0 - 15.0 g/dL 14.1 14.2 12.9  Hematocrit 36.0 - 46.0 % 42.6 - -  Platelets 150 - 400 K/uL 246 - -    CMP     Component Value Date/Time   NA 140 10/24/2014 0351   K 3.9 10/24/2014 0351   CL 106 10/24/2014 0351   CO2 23 10/24/2014 0351   GLUCOSE 91 10/24/2014 0351   BUN 17 10/24/2014 0351   CREATININE 1.15* 10/24/2014 0351   CALCIUM 9.1 10/24/2014 0351   GFRNONAA 41* 10/24/2014 0351   GFRAA 47* 10/24/2014 0351    Lab Results  Component Value Date   INR 2.51* 10/25/2014   INR 2.82* 10/24/2014   INR 2.75* 10/23/2014   Lipid Panel     Component Value Date/Time   CHOL 129 10/22/2014 0542   TRIG 81 10/22/2014 0542   HDL 63 10/22/2014 0542   CHOLHDL 2.0 10/22/2014 0542   VLDL 16 10/22/2014 0542   LDLCALC 50 10/22/2014 0542    Diagnostic Studies Reviewed 10/21/14-CT HEAD WITHOUT  CONTRAST  FINDINGS: Large infarct noted of the right cerebellar hemisphere which involves a small portion of the vermis. Based on density, this is not very acute and likely greater than 64 hr old. No associated overt hemorrhage or significant mass effect. The rest the brain shows advanced small vessel ischemic changes and cortical atrophy. No hydrocephalus identified. The skull is unremarkable.  IMPRESSION: Subacute infarct involving nearly the entire right cerebellar hemisphere and a small portion of the  cerebellar vermis. This infarct is not associated with visible acute hemorrhage or significant mass effect.  10/21/14-MRI HEAD WITHOUT CONTRAST/MRA HEAD WITHOUT CONTRAST  FINDINGS: MRI HEAD FINDINGS  RIGHT inferior cerebellar reduced diffusion with corresponding nearly normalized ADC values and, bright T2 signal. Local mass effect though, fourth ventricle remains patent.  Punctate susceptibility artifact in LEFT occipital lobe. Moderate to severe ventriculomegaly, likely on the basis of global parenchymal brain volume loss as there is overall commensurate enlargement of cerebral sulci and cerebellar folia, similar. Patchy to confluent supratentorial white matter FLAIR T2 hyperintensities suggest chronic small vessel ischemic disease.  No abnormal extra-axial fluid collections. Ocular globes and orbital contents are unremarkable. Trace LEFT maxillary sinus mucosal thickening without air-fluid levels. Small LEFT mastoid effusion. RIGHT mastoid air cells are well aerated. 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 No abnormal sellar expansion. Low signal about the odontoid process likely reflects cerumen. Patient is edentulous.  MRA HEAD FINDINGS  Anterior circulation: Normal flow related enhancement of the cervical, petrous, cavernous and supra clinoid internal carotid arteries though, there is slight irregularity of the vessels through these segments corresponding  to calcific atherosclerosis seen on prior CT. Mildly motion degraded evaluation with normal flow related enhancement of the anterior middle cerebral arteries.  Posterior circulation: LEFT vertebral artery is dominant. Normal flow related enhancement vertebral arteries, basilar artery main branch vessels. Normal pleural enhancement of posterior cerebral arteries.  No large vessel occlusion, hemodynamically significant stenosis, aneurysm, suspicious luminal irregularity.  IMPRESSION: MRI HEAD: Acute to subacute RIGHT posterior inferior cerebellarartery territory infarct. Moderate to severe global parenchymal brain volume loss. Moderate white matter changes suggest chronic small vessel ischemic disease. Solitary LEFT occipital lobe micro hemorrhage, nonspecific.  MRA HEAD: No acute vascular process nor hemodynamically significant stenosis.  Assessment & Plan 1. Essential hypertension Stable. Continue benicar 20mg  daily and hctz 25mg  daily. Continue to monitor  2. Hyperlipidemia LDL at goal.continue zocor 40mg  daily and fish oil 1g daily. Continue to monitor  3. Coronary artery disease involving native coronary artery of native heart with angina pectoris No chest pain as of late. Continue NTG 0.4mg  Q56min PRN for chest pain. Continue ARB and statin. Continue to monitor  4. Impaired balance as late effect of cerebrovascular accident Stable. Continue to work with PT/OT for gait/balance training and ADLs care. Continue fall risk precautions. F/u with Dr. Erlinda Hong at [facility] in about 2 mos.  5. Vitamin D deficiency Stable. Continue vit D 2000iu daily and oscal 500/200mg  bid. Continue to monitor.   6. Gastroesophageal reflux disease, esophagitis presence not specified Stable continue protonix 40mg  daily and monitor.  7. Factor V Leiden Managed coumadin. INR goal is higher end of 2.0-3.0 range. Continue coumadin 2.5mg  on Sun, Tues, Thurs, and Sat and 5mg  on all other days. Continue to monitor INR  weekly.   Time spent: more than 50 minutes  Family/Staff Communication Plan of care discuss with patient, family, and nursing staff. Patient, family, and nursing staff verbalize understanding and agree with plan of care  Arthur Holms, MSN, [facility] [place], [place] [address] [phone] [8am-5pm] After hours: [phone]

## 2014-10-29 ENCOUNTER — Non-Acute Institutional Stay (SKILLED_NURSING_FACILITY): Payer: Medicare Other | Admitting: Internal Medicine

## 2014-10-29 DIAGNOSIS — K219 Gastro-esophageal reflux disease without esophagitis: Secondary | ICD-10-CM

## 2014-10-29 DIAGNOSIS — I63349 Cerebral infarction due to thrombosis of unspecified cerebellar artery: Secondary | ICD-10-CM | POA: Diagnosis not present

## 2014-10-29 DIAGNOSIS — I25119 Atherosclerotic heart disease of native coronary artery with unspecified angina pectoris: Secondary | ICD-10-CM

## 2014-10-29 DIAGNOSIS — D6851 Activated protein C resistance: Secondary | ICD-10-CM

## 2014-10-29 DIAGNOSIS — R531 Weakness: Secondary | ICD-10-CM | POA: Diagnosis not present

## 2014-10-29 DIAGNOSIS — I10 Essential (primary) hypertension: Secondary | ICD-10-CM | POA: Diagnosis not present

## 2014-10-29 DIAGNOSIS — I739 Peripheral vascular disease, unspecified: Secondary | ICD-10-CM

## 2014-10-29 NOTE — Progress Notes (Signed)
Patient ID: Heather Alvarado, female   DOB: 08/05/1923, 80 y.o.   MRN: [id number]     Facility: [facility]    PCP: Precious Reel, MD   Allergies  Allergen Reactions  . Ivp Dye [Iodinated Diagnostic Agents] Hives    Chief Complaint  Patient presents with  . New Admit To SNF     HPI:  80 y/o female pt is here for STR after hospital admission from 10/21/14-10/25/14 with acute right posterior inferior cerebellar artery territory infarct. She is seen in her room today. She denies any concerns.  She has PMH of Factor V Leiden deficiency, HTN, GERD, CAD, HLD, vitamin D deficiency   Review of Systems:  Constitutional: Negative for fever, chills, diaphoresis.  HENT: Negative for congestion Eyes: Negative for eye pain, blurred vision, double vision and discharge.  Respiratory: Negative for cough, shortness of breath and wheezing.   Cardiovascular: Negative for chest pain, palpitations  Gastrointestinal: Negative for heartburn, nausea, vomiting, abdominal pain Genitourinary: Negative for dysuria  Musculoskeletal: Negative for back pain, falls Skin: Negative for itching, rash.  Neurological: Negative for dizziness, tingling, focal weakness and headaches.  Psychiatric/Behavioral: Negative for depression  Past Medical History  Diagnosis Date  . CAD (coronary artery disease)   . Factor V Leiden   . GERD (gastroesophageal reflux disease)   . Multinodular goiter   . Internal hemorrhoids   . Hyperlipidemia   . Hypertension   . Nephrolithiasis   . Osteopenia   . Phlebitis   . PVD (peripheral vascular disease)   . Varicose vein   . Vitamin D deficiency   . DDD (degenerative disc disease), lumbar   . Blood transfusion without reported diagnosis   . Clotting disorder   . Stroke 10/2014   Past Surgical History  Procedure Laterality Date  . Total abdominal hysterectomy    . Bladder repair    . Coronary artery bypass graft    . Coronary artery bypass  graft  2004    lima-lad,rima-rca,lradial-om1&2  . Breast biopsy     Social History:   reports that she has never smoked. She has never used smokeless tobacco. She reports that she drinks alcohol. She reports that she does not use illicit drugs.  Family History  Problem Relation Age of Onset  . Coronary artery disease Mother   . Heart disease Mother   . Alzheimer's disease Sister   . Heart disease Brother   . Heart attack Brother   . Heart attack Brother     Medications: Patient's Medications  New Prescriptions   No medications on file  Previous Medications   CALCIUM-VITAMIN D (OSCAL WITH D) 500-200 MG-UNIT PER TABLET    Take 1 tablet by mouth 2 (two) times daily.    CHOLECALCIFEROL (VITAMIN D) 2000 UNITS CAPS    Take 1 capsule by mouth daily.     HYDROCHLOROTHIAZIDE (HYDRODIURIL) 25 MG TABLET    Take 25 mg by mouth daily.     HYDROCORTISONE (ANUSOL-HC) 25 MG SUPPOSITORY       MECLIZINE (ANTIVERT) 25 MG TABLET    Take 25 mg by mouth 2 (two) times daily as needed for dizziness.    MISC NATURAL PRODUCTS (OSTEO BI-FLEX ADV DOUBLE ST) CAPS    Take 2 capsules by mouth daily.     MULTIPLE VITAMINS-MINERALS (MULTIVITAMIN WITH MINERALS) TABLET    Take 1 tablet by mouth daily.     NITROGLYCERIN (NITROSTAT) 0.4 MG SL TABLET    Place 1 tablet (  0.4 mg total) under the tongue every 5 (five) minutes as needed for chest pain.   OLMESARTAN (BENICAR) 20 MG TABLET    Take 20 mg by mouth daily.     OMEGA-3 FATTY ACIDS (FISH OIL) 1000 MG CAPS    Take 1,000 mg by mouth daily.    ONDANSETRON (ZOFRAN) 4 MG TABLET       PANTOPRAZOLE (PROTONIX) 40 MG TABLET    Take 40 mg by mouth daily.     POTASSIUM CHLORIDE SA (K-DUR,KLOR-CON) 20 MEQ TABLET    Take 20 mEq by mouth daily.     SIMVASTATIN (ZOCOR) 40 MG TABLET    Take 40 mg by mouth at bedtime.     TRAMADOL (ULTRAM) 50 MG TABLET    Take 1 tablet (50 mg total) by mouth every 6 (six) hours as needed.   WARFARIN (COUMADIN) 5 MG TABLET    Take 2.5-5 mg by  mouth See admin instructions. 2.5 mg on Sunday Tuesday Thursday Saturday and 5 mg on all other days  Modified Medications   No medications on file  Discontinued Medications   No medications on file     Physical Exam: Filed Vitals:   10/29/14 1521  BP: 110/75  Pulse: 80  Temp: 98 F (36.7 C)  Resp: 18    General- elderly female in no acute distress Head- atraumatic, normocephalic Eyes- PERRLA, EOMI, no pallor, no icterus, no discharge Neck- no cervical lymphadenopathy Throat- moist mucus membrane Cardiovascular- normal s1,s2, no murmurs, normal dorsalis pedis Respiratory- bilateral clear to auscultation, no wheeze, no rhonchi, no crackles, no use of accessory muscles Abdomen- bowel sounds present, soft, non tender Musculoskeletal- able to move all 4 extremities, has leg edema right > left (chronic per patient) Neurological- no focal deficit Skin- warm and dry Psychiatry- alert and oriented to person, place and time, normal mood and affect   Labs reviewed: Basic Metabolic Panel:  Recent Labs  10/21/14 1121 10/24/14 0351  NA 141 140  K 4.2 3.9  CL 102 106  CO2 24 23  GLUCOSE 150* 91  BUN 28* 17  CREATININE 1.33* 1.15*  CALCIUM 10.2 9.1   Liver Function Tests: No results for input(s): AST, ALT, ALKPHOS, BILITOT, PROT, ALBUMIN in the last 8760 hours.  Recent Labs  10/21/14 1121  LIPASE 40   No results for input(s): AMMONIA in the last 8760 hours. CBC:  Recent Labs  10/21/14 1121  WBC 11.0*  NEUTROABS 6.6  HGB 14.1  HCT 42.6  MCV 90.8  PLT 246   Radiological Exams: 10/21/14-CT HEAD WITHOUT CONTRAST IMPRESSION: Subacute infarct involving nearly the entire right cerebellar hemisphere and a small portion of the cerebellar vermis. This infarct is not associated with visible acute hemorrhage or significant mass effect.  10/21/14-MRI HEAD WITHOUT CONTRAST/MRA HEAD WITHOUT CONTRAST MRI HEAD: Acute to subacute RIGHT posterior inferior cerebellarartery  territory infarct. Moderate to severe global parenchymal brain volume loss. Moderate white matter changes suggest chronic small vessel ischemic disease. Solitary LEFT occipital lobe micro hemorrhage, nonspecific.   MRA HEAD: No acute vascular process nor hemodynamically significant stenosis   Assessment/Plan  Generalized weakness Will have patient work with PT/OT as tolerated to regain strength and restore function.  Fall precautions are in place.  CVA No focal deficit, will have her work with therapy team for balance training. Continue bp medication, coumadin and statin  Essential hypertension Stable. Continue benicar and hctz and monitor bp  gerd Continue protonix for now  PVD Will have her on  ted hose, pt was using this at home.   CAD Remains chest pain free. Continue benicar, statin and prn NTG   Factor V Leiden Continue coumadin with goal inr 2-3   Family/ staff Communication: reviewed care plan with patient and nursing supervisor   Goals of care: short term rehabilitation   Blanchie Serve, MD  [facility] [phone] (Monday-Friday 8 am - 5 pm) [phone] (afterhours)

## 2014-11-08 ENCOUNTER — Non-Acute Institutional Stay (SKILLED_NURSING_FACILITY): Payer: Medicare Other | Admitting: Registered Nurse

## 2014-11-08 ENCOUNTER — Encounter: Payer: Self-pay | Admitting: Registered Nurse

## 2014-11-08 DIAGNOSIS — R791 Abnormal coagulation profile: Secondary | ICD-10-CM

## 2014-11-08 LAB — POCT INR: INR: 1.1 (ref 0.9–1.1)

## 2014-11-08 NOTE — Progress Notes (Signed)
Patient ID: Heather Alvarado, female   DOB: 12/28/1922, 80 y.o.   MRN: [id number]   Place of Service: [facility]  Allergies  Allergen Reactions  . Ivp Dye [Iodinated Diagnostic Agents] Hives    Code Status: Full Code  Goals of Care: Longevity/STR  Chief Complaint  Patient presents with  . Acute Visit    subtherapeutic INR    HPI 80 y.o. female with PMH of Factor V Leiden deficiency, HTN, GERD, CAD, HLD, vitamin D deficiency among others is being seen for an acute visit for subtherapeutic INR. Her INR goal is 2.5-3. INR today is 1.1. Family is at bedside and is concerned that patient is not getting her coumadin like she supposed to. Per [facility], patient has been getting her coumadin as prescribed. She is currently coumadin 2.5mg  daily on Tues, Thurs, Sat, and Sun and coumadin 5mg  on Mon, Wed, and Fri.   Review of Systems Constitutional: Negative for fever and chills HENT: Negative for congestion, and sore throat Eyes: Negative for eye pain and visual disturbance  Cardiovascular: Negative for chest pain, palpitations, and leg swelling Respiratory: Negative cough, shortness of breath, and wheezing.  Gastrointestinal: Negative for nausea and vomiting.  Genitourinary: Negative for  dysuria Musculoskeletal: Negative for back pain, joint pain, and joint swelling.  Neurological:  Negative for headache and dizziness Skin: Negative for rash and wound.   Psychiatric: Negative for depression  Past Medical History  Diagnosis Date  . CAD (coronary artery disease)   . Factor V Leiden   . GERD (gastroesophageal reflux disease)   . Multinodular goiter   . Internal hemorrhoids   . Hyperlipidemia   . Hypertension   . Nephrolithiasis   . Osteopenia   . Phlebitis   . PVD (peripheral vascular disease)   . Varicose vein   . Vitamin D deficiency   . DDD (degenerative disc disease), lumbar   . Blood transfusion without reported diagnosis   . Clotting disorder   . Stroke 10/2014     Past Surgical History  Procedure Laterality Date  . Total abdominal hysterectomy    . Bladder repair    . Coronary artery bypass graft    . Coronary artery bypass graft  2004    lima-lad,rima-rca,lradial-om1&2  . Breast biopsy        Medication List       This list is accurate as of: 11/08/14  2:19 PM.  Always use your most recent med list.               calcium-vitamin D 500-200 MG-UNIT per tablet  Commonly known as:  OSCAL WITH D  Take 1 tablet by mouth 2 (two) times daily.     Fish Oil 1000 MG Caps  Take 1,000 mg by mouth daily.     hydrochlorothiazide 25 MG tablet  Commonly known as:  HYDRODIURIL  Take 25 mg by mouth daily.     hydrocortisone 25 MG suppository  Commonly known as:  ANUSOL-HC     meclizine 25 MG tablet  Commonly known as:  ANTIVERT  Take 25 mg by mouth 2 (two) times daily as needed for dizziness.     multivitamin with minerals tablet  Take 1 tablet by mouth daily.     nitroGLYCERIN 0.4 MG SL tablet  Commonly known as:  NITROSTAT  Place 1 tablet (0.4 mg total) under the tongue every 5 (five) minutes as needed for chest pain.     olmesartan 20 MG tablet  Commonly  known as:  BENICAR  Take 20 mg by mouth daily.     ondansetron 4 MG tablet  Commonly known as:  ZOFRAN     OSTEO BI-FLEX ADV DOUBLE ST Caps  Take 2 capsules by mouth daily.     pantoprazole 40 MG tablet  Commonly known as:  PROTONIX  Take 40 mg by mouth daily.     potassium chloride SA 20 MEQ tablet  Commonly known as:  K-DUR,KLOR-CON  Take 20 mEq by mouth daily.     simvastatin 40 MG tablet  Commonly known as:  ZOCOR  Take 40 mg by mouth at bedtime.     traMADol 50 MG tablet  Commonly known as:  ULTRAM  Take 1 tablet (50 mg total) by mouth every 6 (six) hours as needed.     Vitamin D 2000 UNITS Caps  Take 1 capsule by mouth daily.     warfarin 5 MG tablet  Commonly known as:  COUMADIN  Take 2.5-5 mg by mouth See admin instructions. 2.5 mg on Sunday Tuesday  Thursday Saturday and 5 mg on all other days        Physical Exam Filed Vitals:   11/08/14 1416  BP: 149/72  Pulse: 82  Temp: 96.3 F (35.7 C)  Resp: 20   Constitutional: WDWN elderly female in no acute distress. Conversant and pleasant. HEENT: Normocephalic and atraumatic. PERRL. EOM intact. No icterus. Oral mucosa moist. Posterior pharynx clear of any exudate or lesions.  Neck: Supple and nontender. No lymphadenopathy, masses, or thyromegaly. No JVD or carotid bruits. Cardiac: Normal S1, S2. RRR without appreciable murmurs, rubs, or gallops. Distal pulses intact. Trace pitting edema of RLE Lungs: No respiratory distress. Breath sounds clear bilaterally without rales, rhonchi, or wheezes. Abdomen: Audible bowel sounds in all quadrants. Soft, nontender, nondistended. No palpable mass.  Musculoskeletal: Able to move all four extremities. No joint erythema or tenderness. Skin: Warm and dry. No rash noted. No erythema.  Neurological: Alert and oriented to person and time. Psychiatric: Judgment and insight adequate. Appropriate mood and affect.   Labs Reviewed CBC Latest Ref Rng 10/21/2014 06/28/2011 02/08/2011  WBC 4.0 - 10.5 K/uL 11.0(H) - -  Hemoglobin 12.0 - 15.0 g/dL 14.1 14.2 12.9  Hematocrit 36.0 - 46.0 % 42.6 - -  Platelets 150 - 400 K/uL 246 - -    CMP     Component Value Date/Time   NA 140 10/24/2014 0351   K 3.9 10/24/2014 0351   CL 106 10/24/2014 0351   CO2 23 10/24/2014 0351   GLUCOSE 91 10/24/2014 0351   BUN 17 10/24/2014 0351   CREATININE 1.15* 10/24/2014 0351   CALCIUM 9.1 10/24/2014 0351   GFRNONAA 41* 10/24/2014 0351   GFRAA 47* 10/24/2014 0351    Lab Results  Component Value Date   INR 1.1 11/08/2014   INR 2.51* 10/25/2014   INR 2.82* 10/24/2014   Lipid Panel     Component Value Date/Time   CHOL 129 10/22/2014 0542   TRIG 81 10/22/2014 0542   HDL 63 10/22/2014 0542   CHOLHDL 2.0 10/22/2014 0542   VLDL 16 10/22/2014 0542   LDLCALC 50 10/22/2014  0542   Lab Results  Component Value Date   INR 1.1 11/08/2014   INR 2.51* 10/25/2014   INR 2.82* 10/24/2014   Assessment & Plan 1. Subtherapeutic international normalized ratio (INR) Start Coumadin 2.5mg  on Mon, Wed, and Fri and Coumadin 5mg  on Tues, Thurs, Sat, and Sun. Monitor INR Mon, Wed, and  Fri for now. Continue to monitor   Family/Staff Communication Plan of care discuss with patient, family, and nursing staff. Patient, family, and nursing staff verbalize understanding and agree with plan of care  Arthur Holms, MSN, [facility] [place], [place] [address] [phone] [8am-5pm] After hours: [phone]

## 2014-11-12 ENCOUNTER — Non-Acute Institutional Stay (SKILLED_NURSING_FACILITY): Payer: Medicare Other | Admitting: Registered Nurse

## 2014-11-12 ENCOUNTER — Encounter: Payer: Self-pay | Admitting: Registered Nurse

## 2014-11-12 DIAGNOSIS — M62838 Other muscle spasm: Secondary | ICD-10-CM | POA: Diagnosis not present

## 2014-11-12 NOTE — Progress Notes (Signed)
Patient ID: Heather Alvarado, female   DOB: [birth year], 80 y.o.   MRN: [id number]   Place of Service: [facility]  Allergies  Allergen Reactions  . Ivp Dye [Iodinated Diagnostic Agents] Hives    Code Status: Full Code  Goals of Care: Longevity/STR  Chief Complaint  Patient presents with  . Acute Visit    muscle spasms    HPI 80 y.o. female with PMH of Factor V Leiden deficiency, HTN, GERD, CAD, HLD, vitamin D deficiency among others is being seen for an acute visit for intermittent muscle spasms of intercostal muscle. She took an ultram for pain this morning for pain and it also helped a little with the muscle spasms, but would like have flexeril. Per patient, she usually take flexeril 5mg  twice daily as needed for muscle spasms at home. Otherwise, no complaints reported.   Review of Systems Constitutional: Negative for fever and chills HENT: Negative for congestion, and sore throat Eyes: Negative for eye pain and visual disturbance  Cardiovascular: Negative for chest pain, palpitations, and leg swelling Respiratory: Negative cough, shortness of breath, and wheezing.  Gastrointestinal: Negative for nausea and vomiting.  Musculoskeletal: Negative for back pain, joint pain, and joint swelling. Positive for muscle spasms of of "ribs" Neurological:  Negative for headache and dizziness   Past Medical History  Diagnosis Date  . CAD (coronary artery disease)   . Factor V Leiden   . GERD (gastroesophageal reflux disease)   . Multinodular goiter   . Internal hemorrhoids   . Hyperlipidemia   . Hypertension   . Nephrolithiasis   . Osteopenia   . Phlebitis   . PVD (peripheral vascular disease)   . Varicose vein   . Vitamin D deficiency   . DDD (degenerative disc disease), lumbar   . Blood transfusion without reported diagnosis   . Clotting disorder   . Stroke 10/2014    Past Surgical History  Procedure Laterality Date  . Total abdominal hysterectomy    . Bladder  repair    . Coronary artery bypass graft    . Coronary artery bypass graft  2004    lima-lad,rima-rca,lradial-om1&2  . Breast biopsy        Medication List       This list is accurate as of: 11/12/14  9:04 PM.  Always use your most recent med list.               calcium-vitamin D 500-200 MG-UNIT per tablet  Commonly known as:  OSCAL WITH D  Take 1 tablet by mouth 2 (two) times daily.     Fish Oil 1000 MG Caps  Take 1,000 mg by mouth daily.     hydrochlorothiazide 25 MG tablet  Commonly known as:  HYDRODIURIL  Take 25 mg by mouth daily.     hydrocortisone 25 MG suppository  Commonly known as:  ANUSOL-HC     meclizine 25 MG tablet  Commonly known as:  ANTIVERT  Take 25 mg by mouth 2 (two) times daily as needed for dizziness.     multivitamin with minerals tablet  Take 1 tablet by mouth daily.     nitroGLYCERIN 0.4 MG SL tablet  Commonly known as:  NITROSTAT  Place 1 tablet (0.4 mg total) under the tongue every 5 (five) minutes as needed for chest pain.     olmesartan 20 MG tablet  Commonly known as:  BENICAR  Take 20 mg by mouth daily.     ondansetron 4 MG  tablet  Commonly known as:  ZOFRAN     OSTEO BI-FLEX ADV DOUBLE ST Caps  Take 2 capsules by mouth daily.     pantoprazole 40 MG tablet  Commonly known as:  PROTONIX  Take 40 mg by mouth daily.     potassium chloride SA 20 MEQ tablet  Commonly known as:  K-DUR,KLOR-CON  Take 20 mEq by mouth daily.     simvastatin 40 MG tablet  Commonly known as:  ZOCOR  Take 40 mg by mouth at bedtime.     traMADol 50 MG tablet  Commonly known as:  ULTRAM  Take 1 tablet (50 mg total) by mouth every 6 (six) hours as needed.     Vitamin D 2000 UNITS Caps  Take 1 capsule by mouth daily.     warfarin 5 MG tablet  Commonly known as:  COUMADIN  Take 2.5-5 mg by mouth See admin instructions. 2.5 mg on Sunday Tuesday Thursday Saturday and 5 mg on all other days        Physical Exam Filed Vitals:   11/12/14 2059    BP: 132/72  Pulse: 80  Temp: 98.2 F (36.8 C)  Resp: 20   Constitutional: WDWN elderly female in no acute distress. Conversant and pleasant. HEENT: Normocephalic and atraumatic. PERRL. EOM intact. No icterus.  Cardiac: Normal S1, S2. RRR without appreciable murmurs, rubs, or gallops. Distal pulses intact. Trace pitting edema of RLE Lungs: No respiratory distress. Breath sounds clear bilaterally without rales, rhonchi, or wheezes. Abdomen: Audible bowel sounds in all quadrants. Soft, nontender, nondistended. No palpable mass.  Musculoskeletal: Able to move all four extremities. No joint erythema or tenderness. No pain with palpation of intercostal muscle. No active spasms noted.  Skin: Warm and dry. No rash noted. No erythema.  Neurological: Alert and oriented to person and time. Psychiatric: Judgment and insight adequate. Appropriate mood and affect.   Labs Reviewed CBC Latest Ref Rng 10/21/2014 06/28/2011 02/08/2011  WBC 4.0 - 10.5 K/uL 11.0(H) - -  Hemoglobin 12.0 - 15.0 g/dL 14.1 14.2 12.9  Hematocrit 36.0 - 46.0 % 42.6 - -  Platelets 150 - 400 K/uL 246 - -    CMP     Component Value Date/Time   NA 140 10/24/2014 0351   K 3.9 10/24/2014 0351   CL 106 10/24/2014 0351   CO2 23 10/24/2014 0351   GLUCOSE 91 10/24/2014 0351   BUN 17 10/24/2014 0351   CREATININE 1.15* 10/24/2014 0351   CALCIUM 9.1 10/24/2014 0351   GFRNONAA 41* 10/24/2014 0351   GFRAA 47* 10/24/2014 0351   Assessment & Plan 1. Spasm of muscle Start flexeril 5mg  twice daily as needed for muscle spasms. Continue tramadol 50mg  every six hours as needed for pain. Continue to monitor.    Family/Staff Communication Plan of care discuss with patient, family, and nursing staff. Patient, family, and nursing staff verbalize understanding and agree with plan of care  Arthur Holms, MSN, [facility] [place], [place] [address] [phone] [8am-5pm] After hours: [phone]

## 2014-11-15 ENCOUNTER — Non-Acute Institutional Stay (SKILLED_NURSING_FACILITY): Payer: Medicare Other | Admitting: Registered Nurse

## 2014-11-15 ENCOUNTER — Encounter: Payer: Self-pay | Admitting: Registered Nurse

## 2014-11-15 DIAGNOSIS — I2510 Atherosclerotic heart disease of native coronary artery without angina pectoris: Secondary | ICD-10-CM

## 2014-11-15 DIAGNOSIS — M62838 Other muscle spasm: Secondary | ICD-10-CM | POA: Diagnosis not present

## 2014-11-15 DIAGNOSIS — I69398 Other sequelae of cerebral infarction: Secondary | ICD-10-CM | POA: Diagnosis not present

## 2014-11-15 DIAGNOSIS — E559 Vitamin D deficiency, unspecified: Secondary | ICD-10-CM

## 2014-11-15 DIAGNOSIS — K219 Gastro-esophageal reflux disease without esophagitis: Secondary | ICD-10-CM

## 2014-11-15 DIAGNOSIS — D6851 Activated protein C resistance: Secondary | ICD-10-CM | POA: Diagnosis not present

## 2014-11-15 DIAGNOSIS — E785 Hyperlipidemia, unspecified: Secondary | ICD-10-CM

## 2014-11-15 DIAGNOSIS — I10 Essential (primary) hypertension: Secondary | ICD-10-CM

## 2014-11-15 DIAGNOSIS — R2689 Other abnormalities of gait and mobility: Secondary | ICD-10-CM

## 2014-11-15 LAB — POCT INR: INR: 2.7 — AB (ref <=1.1)

## 2014-11-15 NOTE — Progress Notes (Signed)
Patient ID: Heather Alvarado, female   DOB: Dec 19, 1922, 80 y.o.   MRN: [id number]   Place of Service: [facility]  Allergies  Allergen Reactions  . Ivp Dye [Iodinated Diagnostic Agents] Hives    Code Status: Full Code  Goals of Care: Longevity/STR  Chief Complaint  Patient presents with  . Discharge Note    HPI 80 y.o. female with PMH of factor V Leiden deficiency, HTN, GERD, CAD, HLD, vit D deficiency among others is being seen for a discharge visit. Patient was here for short-term rehabilitation post hospital admission from 10/21/14-10/25/14 for acute to subacute right posterior inferior cerebellar artery territory infarct. Patient has worked with therapy team and is ready to be discharged home with [facility]/OTST, RN and DME ([facility]). Seen in room today. No complaints verbalized. Family is a bedside.   Review of Systems Constitutional: Negative for fever, chills, and fatigue. HENT: Negative for ear pain, congestion, and sore throat Eyes: Negative for eye pain, eye discharge, and visual disturbance  Cardiovascular: Negative for chest pain, palpitations. Positive for RLE swelling (chronic) Respiratory: Negative cough, shortness of breath, and wheezing.  Gastrointestinal: Negative for nausea and vomiting. Negative for abdominal pain, diarrhea and constipation.  Genitourinary: Negative for  dysuria, frequency, urgency, and hematuria Musculoskeletal: Negative for back pain, joint pain, and joint swelling. Positive for back/rib spasms Neurological: Negative for dizziness and headache Skin: Negative for rash  Psychiatric: Negative for depression  Past Medical History  Diagnosis Date  . CAD (coronary artery disease)   . Factor V Leiden   . GERD (gastroesophageal reflux disease)   . Multinodular goiter   . Internal hemorrhoids   . Hyperlipidemia   . Hypertension   . Nephrolithiasis   . Osteopenia   . Phlebitis   . PVD (peripheral vascular disease)   . Varicose vein   .  Vitamin D deficiency   . DDD (degenerative disc disease), lumbar   . Blood transfusion without reported diagnosis   . Clotting disorder   . Stroke 10/2014    Past Surgical History  Procedure Laterality Date  . Total abdominal hysterectomy    . Bladder repair    . Coronary artery bypass graft    . Coronary artery bypass graft  2004    lima-lad,rima-rca,lradial-om1&2  . Breast biopsy      History   Social History  . Marital Status: Married    Spouse Name: N/A    Number of Children: 2  . Years of Education: N/A   Occupational History  . Retired    Social History Main Topics  . Smoking status: Never Smoker   . Smokeless tobacco: Never Used  . Alcohol Use: Yes     Comment: occ  . Drug Use: No  . Sexual Activity: Not on file   Other Topics Concern  . Not on file   Social History Narrative    Family History  Problem Relation Age of Onset  . Coronary artery disease Mother   . Heart disease Mother   . Alzheimer's disease Sister   . Heart disease Brother   . Heart attack Brother   . Heart attack Brother       Medication List       This list is accurate as of: 11/15/14 11:59 PM.  Always use your most recent med list.               calcium-vitamin D 500-200 MG-UNIT per tablet  Commonly known as:  OSCAL WITH  D  Take 1 tablet by mouth 2 (two) times daily.     cyclobenzaprine 5 MG tablet  Commonly known as:  FLEXERIL  Take 5 mg by mouth 2 (two) times daily as needed for muscle spasms.     Fish Oil 1000 MG Caps  Take 1,000 mg by mouth daily.     hydrochlorothiazide 25 MG tablet  Commonly known as:  HYDRODIURIL  Take 25 mg by mouth daily.     hydrocortisone 25 MG suppository  Commonly known as:  ANUSOL-HC     meclizine 25 MG tablet  Commonly known as:  ANTIVERT  Take 25 mg by mouth 2 (two) times daily as needed for dizziness.     multivitamin with minerals tablet  Take 1 tablet by mouth daily.     nitroGLYCERIN 0.4 MG SL tablet  Commonly known as:   NITROSTAT  Place 1 tablet (0.4 mg total) under the tongue every 5 (five) minutes as needed for chest pain.     olmesartan 20 MG tablet  Commonly known as:  BENICAR  Take 20 mg by mouth daily.     ondansetron 4 MG tablet  Commonly known as:  ZOFRAN     OSTEO BI-FLEX ADV DOUBLE ST Caps  Take 2 capsules by mouth daily.     pantoprazole 40 MG tablet  Commonly known as:  PROTONIX  Take 40 mg by mouth daily.     potassium chloride SA 20 MEQ tablet  Commonly known as:  K-DUR,KLOR-CON  Take 20 mEq by mouth daily.     simvastatin 40 MG tablet  Commonly known as:  ZOCOR  Take 40 mg by mouth at bedtime.     traMADol 50 MG tablet  Commonly known as:  ULTRAM  Take 1 tablet (50 mg total) by mouth every 6 (six) hours as needed.     Vitamin D 2000 UNITS Caps  Take 1 capsule by mouth daily.     warfarin 3 MG tablet  Commonly known as:  COUMADIN  Take 4.5 mg by mouth daily.        Physical Exam  BP 119/70 mmHg  Pulse 70  Temp(Src) 97.9 F (36.6 C)  Resp 20  Ht 5\' 2"  (1.575 m)  Wt 126 lb 12.8 oz (57.516 kg)  BMI 23.19 kg/m2  SpO2 99%  Constitutional: WDWN elderly female in no acute distress. Conversant and pleasant. HEENT: Normocephalic and atraumatic. PERRL. EOM intact. No icterus. Oral mucosa moist. Posterior pharynx clear of any exudate or lesions.  Neck: Supple and nontender. No lymphadenopathy, masses, or thyromegaly. No JVD or carotid bruits. Cardiac: Normal S1, S2. RRR without appreciable murmurs, rubs, or gallops. Distal pulses intact. 1+ pitting edema of RLE Lungs: No respiratory distress. Breath sounds clear bilaterally without rales, rhonchi, or wheezes. Abdomen: Audible bowel sounds in all quadrants. Soft, nontender, nondistended.  Musculoskeletal: Able to move all four extremities. No joint erythema or tenderness. Skin: Warm and dry. No rash noted. No erythema.  Neurological: Alert and oriented x3 Psychiatric: Judgment and insight adequate. Appropriate mood and  affect.   Labs Reviewed  CBC Latest Ref Rng 10/21/2014 06/28/2011 02/08/2011  WBC 4.0 - 10.5 K/uL 11.0(H) - -  Hemoglobin 12.0 - 15.0 g/dL 14.1 14.2 12.9  Hematocrit 36.0 - 46.0 % 42.6 - -  Platelets 150 - 400 K/uL 246 - -    CMP Latest Ref Rng 10/24/2014 10/21/2014 06/27/2011  Glucose 70 - 99 mg/dL 91 150(H) 126(H)  BUN 6 - 23 mg/dL 17  28(H) 25(H)  Creatinine 0.50 - 1.10 mg/dL 1.15(H) 1.33(H) 1.10  Sodium 137 - 147 mEq/L 140 141 139  Potassium 3.7 - 5.3 mEq/L 3.9 4.2 4.4  Chloride 96 - 112 mEq/L 106 102 105  CO2 19 - 32 mEq/L 23 24 25   Calcium 8.4 - 10.5 mg/dL 9.1 10.2 9.8   Lab Results  Component Value Date   INR 3.1* 11/17/2014   INR 2.7* 11/15/2014   INR 1.1 11/08/2014   Lipid Panel     Component Value Date/Time   CHOL 129 10/22/2014 0542   TRIG 81 10/22/2014 0542   HDL 63 10/22/2014 0542   CHOLHDL 2.0 10/22/2014 0542   VLDL 16 10/22/2014 0542   LDLCALC 50 10/22/2014 0542    Diagnostic Studies Reviewed 10/21/14-CT HEAD WITHOUT CONTRAST Subacute infarct involving nearly the entire right cerebellar hemisphere and a small portion of the cerebellar vermis. This infarct is not associated with visible acute hemorrhage or significant mass effect.  10/21/14-MRI HEAD WITHOUT CONTRAST/MRA HEAD WITHOUT CONTRAST MRI HEAD: Acute to subacute RIGHT posterior inferior cerebellarartery territory infarct. Moderate to severe global parenchymal brain volume loss. Moderate white matter changes suggest chronic small vessel ischemic disease. Solitary LEFT occipital lobe micro hemorrhage, nonspecific.  MRA HEAD: No acute vascular process nor hemodynamically significant stenosis.  Assessment & Plan  1. Impaired balance as late effect of cerebrovascular accident Improving. Continue HH PT/OT for gait/balance/strength training and ST for training on cognition and safety strategies. Continue to f/u with neurology.   2. Factor V Leiden Managed with coumadin. INR goal is higher end of 2.0-3.0  range. INR today is 3.1. Will decrease coumadin to 4.5mg  daily. [place] RN to recheck INR on 11/19/14. F/u with PCP  3. Essential hypertension Stable. Continue hctz 25mg  daily and olmesartan 20mg  daily.   4. Spasm of muscle Continue flexeril 5mg  twice daily as needed for spasms and tramadol 50mg  every six hours as needed for pain.  5. Hyperlipidemia LDL at goal. Continue zocor 40mg  daily  6. Coronary artery disease involving native coronary artery of native heart without angina pectoris Remains chest pain free. Continue SL nitro 0.4mg  every 5 minutes for chest pain, may repeat up to 2 doses.   7. Vitamin D deficiency Continue vit D 2000 iu daily and oscal 500/200mg  twice daily.   8. GERD Continue protonix 40mg  daily.   Home health services: PT/OT/ST; RN DME required: [facility] PCP follow-up: Dr. Virgina Jock on 11/29/14 at 11:30pm 30-day supply of prescription medications provided. (#60 tramadol 50mg )  Family/Staff Communication Plan of care discussed with patient, family, and nursing staff. Patient, family, and nursing staff verbalized understanding and agree with plan of care. No additional questions or concerns reported.    Arthur Holms, MSN, AGNP-C [facility] [address] [place], [place] [address] [phone] [8am-5pm] After hours: [phone]

## 2014-11-17 LAB — POCT INR: INR: 3.1 — AB (ref <=1.1)

## 2014-11-19 DIAGNOSIS — I739 Peripheral vascular disease, unspecified: Secondary | ICD-10-CM | POA: Diagnosis not present

## 2014-11-19 DIAGNOSIS — D6859 Other primary thrombophilia: Secondary | ICD-10-CM | POA: Diagnosis not present

## 2014-11-19 DIAGNOSIS — I2510 Atherosclerotic heart disease of native coronary artery without angina pectoris: Secondary | ICD-10-CM | POA: Diagnosis not present

## 2014-11-19 DIAGNOSIS — I69354 Hemiplegia and hemiparesis following cerebral infarction affecting left non-dominant side: Secondary | ICD-10-CM | POA: Diagnosis not present

## 2014-11-19 DIAGNOSIS — Z7901 Long term (current) use of anticoagulants: Secondary | ICD-10-CM | POA: Diagnosis not present

## 2014-11-19 DIAGNOSIS — Z5181 Encounter for therapeutic drug level monitoring: Secondary | ICD-10-CM | POA: Diagnosis not present

## 2014-11-19 DIAGNOSIS — M6281 Muscle weakness (generalized): Secondary | ICD-10-CM | POA: Diagnosis not present

## 2014-11-19 DIAGNOSIS — I10 Essential (primary) hypertension: Secondary | ICD-10-CM | POA: Diagnosis not present

## 2014-11-22 DIAGNOSIS — I6529 Occlusion and stenosis of unspecified carotid artery: Secondary | ICD-10-CM | POA: Diagnosis not present

## 2014-11-22 DIAGNOSIS — R739 Hyperglycemia, unspecified: Secondary | ICD-10-CM | POA: Diagnosis not present

## 2014-11-22 DIAGNOSIS — I10 Essential (primary) hypertension: Secondary | ICD-10-CM | POA: Diagnosis not present

## 2014-11-22 DIAGNOSIS — R627 Adult failure to thrive: Secondary | ICD-10-CM | POA: Diagnosis not present

## 2014-11-22 DIAGNOSIS — D682 Hereditary deficiency of other clotting factors: Secondary | ICD-10-CM | POA: Diagnosis not present

## 2014-11-22 DIAGNOSIS — I2510 Atherosclerotic heart disease of native coronary artery without angina pectoris: Secondary | ICD-10-CM | POA: Diagnosis not present

## 2014-11-22 DIAGNOSIS — I739 Peripheral vascular disease, unspecified: Secondary | ICD-10-CM | POA: Diagnosis not present

## 2014-11-22 DIAGNOSIS — M6281 Muscle weakness (generalized): Secondary | ICD-10-CM | POA: Diagnosis not present

## 2014-11-22 DIAGNOSIS — B029 Zoster without complications: Secondary | ICD-10-CM | POA: Diagnosis not present

## 2014-11-22 DIAGNOSIS — Z7901 Long term (current) use of anticoagulants: Secondary | ICD-10-CM | POA: Diagnosis not present

## 2014-11-22 DIAGNOSIS — I6990 Unspecified sequelae of unspecified cerebrovascular disease: Secondary | ICD-10-CM | POA: Diagnosis not present

## 2014-11-22 DIAGNOSIS — I69354 Hemiplegia and hemiparesis following cerebral infarction affecting left non-dominant side: Secondary | ICD-10-CM | POA: Diagnosis not present

## 2014-11-22 DIAGNOSIS — D6859 Other primary thrombophilia: Secondary | ICD-10-CM | POA: Diagnosis not present

## 2014-11-23 DIAGNOSIS — I10 Essential (primary) hypertension: Secondary | ICD-10-CM | POA: Diagnosis not present

## 2014-11-23 DIAGNOSIS — I69354 Hemiplegia and hemiparesis following cerebral infarction affecting left non-dominant side: Secondary | ICD-10-CM | POA: Diagnosis not present

## 2014-11-23 DIAGNOSIS — I739 Peripheral vascular disease, unspecified: Secondary | ICD-10-CM | POA: Diagnosis not present

## 2014-11-23 DIAGNOSIS — M6281 Muscle weakness (generalized): Secondary | ICD-10-CM | POA: Diagnosis not present

## 2014-11-23 DIAGNOSIS — I2510 Atherosclerotic heart disease of native coronary artery without angina pectoris: Secondary | ICD-10-CM | POA: Diagnosis not present

## 2014-11-23 DIAGNOSIS — D6859 Other primary thrombophilia: Secondary | ICD-10-CM | POA: Diagnosis not present

## 2014-11-24 DIAGNOSIS — I739 Peripheral vascular disease, unspecified: Secondary | ICD-10-CM | POA: Diagnosis not present

## 2014-11-24 DIAGNOSIS — I2510 Atherosclerotic heart disease of native coronary artery without angina pectoris: Secondary | ICD-10-CM | POA: Diagnosis not present

## 2014-11-24 DIAGNOSIS — I10 Essential (primary) hypertension: Secondary | ICD-10-CM | POA: Diagnosis not present

## 2014-11-24 DIAGNOSIS — I69354 Hemiplegia and hemiparesis following cerebral infarction affecting left non-dominant side: Secondary | ICD-10-CM | POA: Diagnosis not present

## 2014-11-24 DIAGNOSIS — D6859 Other primary thrombophilia: Secondary | ICD-10-CM | POA: Diagnosis not present

## 2014-11-24 DIAGNOSIS — M6281 Muscle weakness (generalized): Secondary | ICD-10-CM | POA: Diagnosis not present

## 2014-11-26 DIAGNOSIS — D6859 Other primary thrombophilia: Secondary | ICD-10-CM | POA: Diagnosis not present

## 2014-11-26 DIAGNOSIS — M6281 Muscle weakness (generalized): Secondary | ICD-10-CM | POA: Diagnosis not present

## 2014-11-26 DIAGNOSIS — I2510 Atherosclerotic heart disease of native coronary artery without angina pectoris: Secondary | ICD-10-CM | POA: Diagnosis not present

## 2014-11-26 DIAGNOSIS — I739 Peripheral vascular disease, unspecified: Secondary | ICD-10-CM | POA: Diagnosis not present

## 2014-11-26 DIAGNOSIS — I69354 Hemiplegia and hemiparesis following cerebral infarction affecting left non-dominant side: Secondary | ICD-10-CM | POA: Diagnosis not present

## 2014-11-26 DIAGNOSIS — I10 Essential (primary) hypertension: Secondary | ICD-10-CM | POA: Diagnosis not present

## 2014-11-29 DIAGNOSIS — D6859 Other primary thrombophilia: Secondary | ICD-10-CM | POA: Diagnosis not present

## 2014-11-29 DIAGNOSIS — I10 Essential (primary) hypertension: Secondary | ICD-10-CM | POA: Diagnosis not present

## 2014-11-29 DIAGNOSIS — Z6823 Body mass index (BMI) 23.0-23.9, adult: Secondary | ICD-10-CM | POA: Diagnosis not present

## 2014-11-29 DIAGNOSIS — M6281 Muscle weakness (generalized): Secondary | ICD-10-CM | POA: Diagnosis not present

## 2014-11-29 DIAGNOSIS — Z7901 Long term (current) use of anticoagulants: Secondary | ICD-10-CM | POA: Diagnosis not present

## 2014-11-29 DIAGNOSIS — G4700 Insomnia, unspecified: Secondary | ICD-10-CM | POA: Diagnosis not present

## 2014-11-29 DIAGNOSIS — B029 Zoster without complications: Secondary | ICD-10-CM | POA: Diagnosis not present

## 2014-11-29 DIAGNOSIS — I69354 Hemiplegia and hemiparesis following cerebral infarction affecting left non-dominant side: Secondary | ICD-10-CM | POA: Diagnosis not present

## 2014-11-29 DIAGNOSIS — I739 Peripheral vascular disease, unspecified: Secondary | ICD-10-CM | POA: Diagnosis not present

## 2014-11-29 DIAGNOSIS — I2510 Atherosclerotic heart disease of native coronary artery without angina pectoris: Secondary | ICD-10-CM | POA: Diagnosis not present

## 2014-11-30 DIAGNOSIS — I2510 Atherosclerotic heart disease of native coronary artery without angina pectoris: Secondary | ICD-10-CM | POA: Diagnosis not present

## 2014-11-30 DIAGNOSIS — D6859 Other primary thrombophilia: Secondary | ICD-10-CM | POA: Diagnosis not present

## 2014-11-30 DIAGNOSIS — I10 Essential (primary) hypertension: Secondary | ICD-10-CM | POA: Diagnosis not present

## 2014-11-30 DIAGNOSIS — I69354 Hemiplegia and hemiparesis following cerebral infarction affecting left non-dominant side: Secondary | ICD-10-CM | POA: Diagnosis not present

## 2014-11-30 DIAGNOSIS — I739 Peripheral vascular disease, unspecified: Secondary | ICD-10-CM | POA: Diagnosis not present

## 2014-11-30 DIAGNOSIS — M6281 Muscle weakness (generalized): Secondary | ICD-10-CM | POA: Diagnosis not present

## 2014-12-07 DIAGNOSIS — I739 Peripheral vascular disease, unspecified: Secondary | ICD-10-CM | POA: Diagnosis not present

## 2014-12-07 DIAGNOSIS — I2510 Atherosclerotic heart disease of native coronary artery without angina pectoris: Secondary | ICD-10-CM | POA: Diagnosis not present

## 2014-12-07 DIAGNOSIS — M6281 Muscle weakness (generalized): Secondary | ICD-10-CM | POA: Diagnosis not present

## 2014-12-07 DIAGNOSIS — I10 Essential (primary) hypertension: Secondary | ICD-10-CM | POA: Diagnosis not present

## 2014-12-07 DIAGNOSIS — I69354 Hemiplegia and hemiparesis following cerebral infarction affecting left non-dominant side: Secondary | ICD-10-CM | POA: Diagnosis not present

## 2014-12-07 DIAGNOSIS — D6859 Other primary thrombophilia: Secondary | ICD-10-CM | POA: Diagnosis not present

## 2014-12-08 DIAGNOSIS — I739 Peripheral vascular disease, unspecified: Secondary | ICD-10-CM | POA: Diagnosis not present

## 2014-12-08 DIAGNOSIS — I69354 Hemiplegia and hemiparesis following cerebral infarction affecting left non-dominant side: Secondary | ICD-10-CM | POA: Diagnosis not present

## 2014-12-08 DIAGNOSIS — D6859 Other primary thrombophilia: Secondary | ICD-10-CM | POA: Diagnosis not present

## 2014-12-08 DIAGNOSIS — I10 Essential (primary) hypertension: Secondary | ICD-10-CM | POA: Diagnosis not present

## 2014-12-08 DIAGNOSIS — M6281 Muscle weakness (generalized): Secondary | ICD-10-CM | POA: Diagnosis not present

## 2014-12-08 DIAGNOSIS — I2510 Atherosclerotic heart disease of native coronary artery without angina pectoris: Secondary | ICD-10-CM | POA: Diagnosis not present

## 2014-12-09 DIAGNOSIS — M6281 Muscle weakness (generalized): Secondary | ICD-10-CM | POA: Diagnosis not present

## 2014-12-09 DIAGNOSIS — D6859 Other primary thrombophilia: Secondary | ICD-10-CM | POA: Diagnosis not present

## 2014-12-09 DIAGNOSIS — I739 Peripheral vascular disease, unspecified: Secondary | ICD-10-CM | POA: Diagnosis not present

## 2014-12-09 DIAGNOSIS — I69354 Hemiplegia and hemiparesis following cerebral infarction affecting left non-dominant side: Secondary | ICD-10-CM | POA: Diagnosis not present

## 2014-12-09 DIAGNOSIS — I2510 Atherosclerotic heart disease of native coronary artery without angina pectoris: Secondary | ICD-10-CM | POA: Diagnosis not present

## 2014-12-09 DIAGNOSIS — I10 Essential (primary) hypertension: Secondary | ICD-10-CM | POA: Diagnosis not present

## 2014-12-13 DIAGNOSIS — I2510 Atherosclerotic heart disease of native coronary artery without angina pectoris: Secondary | ICD-10-CM | POA: Diagnosis not present

## 2014-12-13 DIAGNOSIS — I10 Essential (primary) hypertension: Secondary | ICD-10-CM | POA: Diagnosis not present

## 2014-12-13 DIAGNOSIS — I69354 Hemiplegia and hemiparesis following cerebral infarction affecting left non-dominant side: Secondary | ICD-10-CM | POA: Diagnosis not present

## 2014-12-13 DIAGNOSIS — D6859 Other primary thrombophilia: Secondary | ICD-10-CM | POA: Diagnosis not present

## 2014-12-13 DIAGNOSIS — I739 Peripheral vascular disease, unspecified: Secondary | ICD-10-CM | POA: Diagnosis not present

## 2014-12-13 DIAGNOSIS — M6281 Muscle weakness (generalized): Secondary | ICD-10-CM | POA: Diagnosis not present

## 2014-12-14 DIAGNOSIS — I69354 Hemiplegia and hemiparesis following cerebral infarction affecting left non-dominant side: Secondary | ICD-10-CM | POA: Diagnosis not present

## 2014-12-14 DIAGNOSIS — I10 Essential (primary) hypertension: Secondary | ICD-10-CM | POA: Diagnosis not present

## 2014-12-14 DIAGNOSIS — M6281 Muscle weakness (generalized): Secondary | ICD-10-CM | POA: Diagnosis not present

## 2014-12-14 DIAGNOSIS — I739 Peripheral vascular disease, unspecified: Secondary | ICD-10-CM | POA: Diagnosis not present

## 2014-12-14 DIAGNOSIS — I2510 Atherosclerotic heart disease of native coronary artery without angina pectoris: Secondary | ICD-10-CM | POA: Diagnosis not present

## 2014-12-14 DIAGNOSIS — D6859 Other primary thrombophilia: Secondary | ICD-10-CM | POA: Diagnosis not present

## 2014-12-15 DIAGNOSIS — I2510 Atherosclerotic heart disease of native coronary artery without angina pectoris: Secondary | ICD-10-CM | POA: Diagnosis not present

## 2014-12-15 DIAGNOSIS — D682 Hereditary deficiency of other clotting factors: Secondary | ICD-10-CM | POA: Diagnosis not present

## 2014-12-15 DIAGNOSIS — I69354 Hemiplegia and hemiparesis following cerebral infarction affecting left non-dominant side: Secondary | ICD-10-CM | POA: Diagnosis not present

## 2014-12-15 DIAGNOSIS — M6281 Muscle weakness (generalized): Secondary | ICD-10-CM | POA: Diagnosis not present

## 2014-12-15 DIAGNOSIS — D6859 Other primary thrombophilia: Secondary | ICD-10-CM | POA: Diagnosis not present

## 2014-12-15 DIAGNOSIS — Z7901 Long term (current) use of anticoagulants: Secondary | ICD-10-CM | POA: Diagnosis not present

## 2014-12-15 DIAGNOSIS — I10 Essential (primary) hypertension: Secondary | ICD-10-CM | POA: Diagnosis not present

## 2014-12-15 DIAGNOSIS — I739 Peripheral vascular disease, unspecified: Secondary | ICD-10-CM | POA: Diagnosis not present

## 2014-12-16 DIAGNOSIS — I10 Essential (primary) hypertension: Secondary | ICD-10-CM | POA: Diagnosis not present

## 2014-12-16 DIAGNOSIS — I69354 Hemiplegia and hemiparesis following cerebral infarction affecting left non-dominant side: Secondary | ICD-10-CM | POA: Diagnosis not present

## 2014-12-16 DIAGNOSIS — D6859 Other primary thrombophilia: Secondary | ICD-10-CM | POA: Diagnosis not present

## 2014-12-16 DIAGNOSIS — M6281 Muscle weakness (generalized): Secondary | ICD-10-CM | POA: Diagnosis not present

## 2014-12-16 DIAGNOSIS — I739 Peripheral vascular disease, unspecified: Secondary | ICD-10-CM | POA: Diagnosis not present

## 2014-12-16 DIAGNOSIS — I2510 Atherosclerotic heart disease of native coronary artery without angina pectoris: Secondary | ICD-10-CM | POA: Diagnosis not present

## 2014-12-22 DIAGNOSIS — I69354 Hemiplegia and hemiparesis following cerebral infarction affecting left non-dominant side: Secondary | ICD-10-CM | POA: Diagnosis not present

## 2014-12-22 DIAGNOSIS — I739 Peripheral vascular disease, unspecified: Secondary | ICD-10-CM | POA: Diagnosis not present

## 2014-12-22 DIAGNOSIS — I2510 Atherosclerotic heart disease of native coronary artery without angina pectoris: Secondary | ICD-10-CM | POA: Diagnosis not present

## 2014-12-22 DIAGNOSIS — I10 Essential (primary) hypertension: Secondary | ICD-10-CM | POA: Diagnosis not present

## 2014-12-22 DIAGNOSIS — M6281 Muscle weakness (generalized): Secondary | ICD-10-CM | POA: Diagnosis not present

## 2014-12-22 DIAGNOSIS — D6859 Other primary thrombophilia: Secondary | ICD-10-CM | POA: Diagnosis not present

## 2014-12-23 DIAGNOSIS — I739 Peripheral vascular disease, unspecified: Secondary | ICD-10-CM | POA: Diagnosis not present

## 2014-12-23 DIAGNOSIS — I69354 Hemiplegia and hemiparesis following cerebral infarction affecting left non-dominant side: Secondary | ICD-10-CM | POA: Diagnosis not present

## 2014-12-23 DIAGNOSIS — D6859 Other primary thrombophilia: Secondary | ICD-10-CM | POA: Diagnosis not present

## 2014-12-23 DIAGNOSIS — I10 Essential (primary) hypertension: Secondary | ICD-10-CM | POA: Diagnosis not present

## 2014-12-23 DIAGNOSIS — M6281 Muscle weakness (generalized): Secondary | ICD-10-CM | POA: Diagnosis not present

## 2014-12-23 DIAGNOSIS — I2510 Atherosclerotic heart disease of native coronary artery without angina pectoris: Secondary | ICD-10-CM | POA: Diagnosis not present

## 2014-12-28 DIAGNOSIS — D682 Hereditary deficiency of other clotting factors: Secondary | ICD-10-CM | POA: Diagnosis not present

## 2014-12-28 DIAGNOSIS — Z7901 Long term (current) use of anticoagulants: Secondary | ICD-10-CM | POA: Diagnosis not present

## 2015-01-04 ENCOUNTER — Encounter: Payer: Self-pay | Admitting: Neurology

## 2015-01-04 ENCOUNTER — Ambulatory Visit (INDEPENDENT_AMBULATORY_CARE_PROVIDER_SITE_OTHER): Payer: Medicare Other | Admitting: Neurology

## 2015-01-04 VITALS — BP 115/63 | Ht 62.0 in | Wt 130.0 lb

## 2015-01-04 DIAGNOSIS — Z7901 Long term (current) use of anticoagulants: Secondary | ICD-10-CM | POA: Diagnosis not present

## 2015-01-04 DIAGNOSIS — D6851 Activated protein C resistance: Secondary | ICD-10-CM | POA: Diagnosis not present

## 2015-01-04 DIAGNOSIS — E785 Hyperlipidemia, unspecified: Secondary | ICD-10-CM | POA: Insufficient documentation

## 2015-01-04 DIAGNOSIS — I63011 Cerebral infarction due to thrombosis of right vertebral artery: Secondary | ICD-10-CM | POA: Diagnosis not present

## 2015-01-04 DIAGNOSIS — I10 Essential (primary) hypertension: Secondary | ICD-10-CM

## 2015-01-04 NOTE — Patient Instructions (Signed)
-   continue coumadin and simvastatin for stroke prevention and factor V laiden mutation - INR goal 2.5-3 ideally - regular exercise at home - Follow up with your primary care physician for stroke risk factor modification. Recommend maintain blood pressure goal <130/80, diabetes with hemoglobin A1c goal below 6.5% and lipids with LDL cholesterol goal below 70 mg/dL.  - check BP at home is recommended. - follow up in 6 months.

## 2015-01-04 NOTE — Progress Notes (Signed)
STROKE NEUROLOGY FOLLOW UP NOTE  NAME: Heather Alvarado DOB: 30-May-1923  REASON FOR VISIT: stroke follow up HISTORY FROM: chart and pt and son  Today we had the pleasure of seeing Heather Alvarado in follow-up at our Neurology Clinic. Pt was accompanied by son.   History Summary 81 y.o. female on chronic coumadin for Factor V Leiden deficiency, CAD, HLD, HTN, PVD was admitted to [facility] on 10/22/14 due to balance difficulties for a week. CT showed a right cerebellar infarct. [name] is on coumadin and INR was 2.0 on admission. The patient's son reported that he believed the INR has always been greater than 2.0. Records from Dr. Keane Police office indicate an INR 1.7 and 1.9 in the past but it was not clear when this occurred, but on 10/11/14 was 2.7. She denies prior history of stroke. She can not tell what symptoms leading her to check for factor V laiden mutation but she has been on coumadin for a long time. No clot has been known to her.   During admission, she had MRI confirmed acute to subacute right large cerebellar infarct in PICA territory, but MRA unremarkable. It was considered as small vessel disease and she was continued on Coumadin with INR goal 2-3. She was sent to SNF on discharge   Interval History During the interval time, the patient has been doing well. She remained in SNF for 4-5 week after discharge and then [facility] for a while. Now she is at home doing home exercises by herself. Her INR goal was increased to 2.5-3. She still has some fluctuation of her INR. On 11/08/2014, her INR was 1.1. However the last INR checked was 2.3. Patient stated that her symptoms all resolved, and she is walking without cane or walker at home. She denies any dizziness headache, nausea vomiting. She is not on meclizine or Zofran anymore.  REVIEW OF SYSTEMS: Full 14 system review of systems performed and notable only for those listed below and in HPI above, all others are negative:  Constitutional: N/A    Cardiovascular: Swelling in legs for years  Ear/Nose/Throat: N/A  Skin: N/A  Eyes: N/A  Respiratory: N/A  Gastroitestinal: N/A  Genitourinary: N/A Hematology/Lymphatic: N/A  Endocrine: N/A  Musculoskeletal: N/A  Allergy/Immunology: N/A  Neurological: N/A  Psychiatric: N/A  The following represents the patient's updated allergies and side effects list: Allergies  Allergen Reactions  . Ivp Dye [Iodinated Diagnostic Agents] Hives    The neurologically relevant items on the patient's problem list were reviewed on today's visit.  Neurologic Examination  A problem focused neurological exam (12 or more points of the single system neurologic examination, vital signs counts as 1 point, cranial nerves count for 8 points) was performed.  Blood pressure 115/63, height 5\' 2"  (1.575 m), weight 130 lb (58.968 kg).  General - Well nourished, well developed, in no apparent distress.  Ophthalmologic - fundi not visualized due to small pupils.  Cardiovascular - Regular rate and rhythm with no murmur.  Mental Status -  Level of arousal and orientation to time, place, and person were intact. Language including expression, naming, repetition, comprehension was assessed and found intact. Fund of Knowledge was assessed and was impaired, with present 2/5.  Cranial Nerves II - XII - II - Visual field intact OU. III, IV, VI - Extraocular movements intact. V - Facial sensation intact bilaterally. VII - Facial movement intact bilaterally. VIII - Hearing & vestibular intact bilaterally. X - Palate elevates symmetrically. XI - Chin turning &  shoulder shrug intact bilaterally. XII - Tongue protrusion intact.  Motor Strength - The patient's strength was normal in all extremities and pronator drift was absent.  Bulk was normal and fasciculations were absent.   Motor Tone - Muscle tone was assessed at the neck and appendages and was normal.  Reflexes - The patient's reflexes were normal in all  extremities and she had no pathological reflexes.  Sensory - Light touch, temperature/pinprick were assessed and were normal.    Coordination - The patient had normal movements in the hands and feet with no ataxia or dysmetria.  Tremor was absent.  Gait and Station - The patient's transfers, posture, gait, station, and turns were observed as normal.  Data reviewed: I personally reviewed the images and agree with the radiology interpretations.  Dg Chest 2 View 10/21/2014  No acute abnormality noted.   Ct Head Wo Contrast 10/21/2014  Subacute infarct involving nearly the entire right cerebellar hemisphere and a small portion of the cerebellar vermis. This infarct is not associated with visible acute hemorrhage or significant mass effect.   10/22/2014  MRI HEAD:  Acute to subacute RIGHT posterior inferior cerebellar artery territory infarct. Moderate to severe global parenchymal brain volume loss. Moderate white matter changes suggest chronic small vessel ischemic disease. Solitary LEFT occipital lobe micro hemorrhage, nonspecific.  MRA HEAD: No acute vascular process nor hemodynamically significant stenosis.   CUS - Right: 40-59% internal carotid artery stenosis (high end of the range). Left: 1-39% ICA stenosis. Bilateral: Vertebral artery flow is antegrade.  2D echo - Left ventricle: The cavity size was normal. Wall thickness was normal. Systolic function was normal. The estimated ejection fraction was in the range of 60% to 65%. Doppler parameters are consistent with abnormal left ventricular relaxation (grade 1 diastolic dysfunction). - Aortic valve: There was trivial regurgitation. - Mitral valve: There was mild regurgitation.  Component     Latest Ref Rng 10/22/2014  Cholesterol     0 - 200 mg/dL 129  Triglycerides     <150 mg/dL 81  HDL     >39 mg/dL 63  Total CHOL/HDL Ratio      2.0  VLDL     0 - 40 mg/dL 16  LDL (calc)     0 - 99  mg/dL 50  Hgb A1c MFr Bld     <5.7 % 6.1 (H)  Mean Plasma Glucose     <117 mg/dL 128 (H)    Assessment: As you may recall, she is a 81 y.o. Caucasian female with PMH of Factor V Leiden deficiency on chronic coumadin, CAD, HLD, HTN, PVD was admitted to [facility] on 10/22/14 due to balance difficulties for a week. MRI showed right cerebellar stroke within PICA territory. Her INR on admission was 2, however it was suspected that her INR was between 1.7-2.0 at time of stroke happened. Her INR goal was increased to 2.5-3.0. She is still has some fluctuation of MR during the time, however, largely her INR was about 2. She is also on statin for stroke prevention and she had complete resolution of her symptoms.  Plan:  - Continue Coumadin and statin for stroke prevention - INR goal 2.5-3 - Continue home exercises - Follow up with your primary care physician for stroke risk factor modification. Recommend maintain blood pressure goal <130/80, diabetes with hemoglobin A1c goal below 6.5% and lipids with LDL cholesterol goal below 70 mg/dL.  - Recommend to check blood pressure at home with blood pressure monitoring device - RTC  in 6 months  No orders of the defined types were placed in this encounter.    No orders of the defined types were placed in this encounter.    Patient Instructions  - continue coumadin and simvastatin for stroke prevention and factor V laiden mutation - INR goal 2.5-3 ideally - regular exercise at home - Follow up with your primary care physician for stroke risk factor modification. Recommend maintain blood pressure goal <130/80, diabetes with hemoglobin A1c goal below 6.5% and lipids with LDL cholesterol goal below 70 mg/dL.  - check BP at home is recommended. - follow up in 6 months.    Rosalin Hawking, MD PhD [facility] [address], [place], [place] [address] [phone]

## 2015-01-11 DIAGNOSIS — D682 Hereditary deficiency of other clotting factors: Secondary | ICD-10-CM | POA: Diagnosis not present

## 2015-01-11 DIAGNOSIS — Z7901 Long term (current) use of anticoagulants: Secondary | ICD-10-CM | POA: Diagnosis not present

## 2015-02-01 DIAGNOSIS — D682 Hereditary deficiency of other clotting factors: Secondary | ICD-10-CM | POA: Diagnosis not present

## 2015-02-01 DIAGNOSIS — Z7901 Long term (current) use of anticoagulants: Secondary | ICD-10-CM | POA: Diagnosis not present

## 2015-02-01 DIAGNOSIS — K644 Residual hemorrhoidal skin tags: Secondary | ICD-10-CM | POA: Diagnosis not present

## 2015-02-21 ENCOUNTER — Emergency Department (HOSPITAL_COMMUNITY): Payer: Medicare Other

## 2015-02-21 ENCOUNTER — Emergency Department (HOSPITAL_COMMUNITY)
Admission: EM | Admit: 2015-02-21 | Discharge: 2015-02-21 | Disposition: A | Discharge status: Home / Self Care | Payer: Medicare Other | Attending: Emergency Medicine | Admitting: Emergency Medicine

## 2015-02-21 ENCOUNTER — Encounter (HOSPITAL_COMMUNITY): Payer: Self-pay | Admitting: Emergency Medicine

## 2015-02-21 DIAGNOSIS — Z951 Presence of aortocoronary bypass graft: Secondary | ICD-10-CM | POA: Insufficient documentation

## 2015-02-21 DIAGNOSIS — R112 Nausea with vomiting, unspecified: Secondary | ICD-10-CM

## 2015-02-21 DIAGNOSIS — I4510 Unspecified right bundle-branch block: Secondary | ICD-10-CM | POA: Diagnosis not present

## 2015-02-21 DIAGNOSIS — Z79899 Other long term (current) drug therapy: Secondary | ICD-10-CM | POA: Diagnosis not present

## 2015-02-21 DIAGNOSIS — R270 Ataxia, unspecified: Secondary | ICD-10-CM

## 2015-02-21 DIAGNOSIS — N3090 Cystitis, unspecified without hematuria: Secondary | ICD-10-CM | POA: Diagnosis not present

## 2015-02-21 DIAGNOSIS — Z7901 Long term (current) use of anticoagulants: Secondary | ICD-10-CM | POA: Insufficient documentation

## 2015-02-21 DIAGNOSIS — Z87442 Personal history of urinary calculi: Secondary | ICD-10-CM | POA: Insufficient documentation

## 2015-02-21 DIAGNOSIS — K219 Gastro-esophageal reflux disease without esophagitis: Secondary | ICD-10-CM | POA: Diagnosis not present

## 2015-02-21 DIAGNOSIS — M8580 Other specified disorders of bone density and structure, unspecified site: Secondary | ICD-10-CM | POA: Insufficient documentation

## 2015-02-21 DIAGNOSIS — I639 Cerebral infarction, unspecified: Secondary | ICD-10-CM | POA: Diagnosis not present

## 2015-02-21 DIAGNOSIS — Z862 Personal history of diseases of the blood and blood-forming organs and certain disorders involving the immune mechanism: Secondary | ICD-10-CM | POA: Insufficient documentation

## 2015-02-21 DIAGNOSIS — Z8673 Personal history of transient ischemic attack (TIA), and cerebral infarction without residual deficits: Secondary | ICD-10-CM | POA: Diagnosis not present

## 2015-02-21 DIAGNOSIS — E669 Obesity, unspecified: Secondary | ICD-10-CM | POA: Insufficient documentation

## 2015-02-21 DIAGNOSIS — I10 Essential (primary) hypertension: Secondary | ICD-10-CM | POA: Diagnosis not present

## 2015-02-21 DIAGNOSIS — Z8672 Personal history of thrombophlebitis: Secondary | ICD-10-CM | POA: Diagnosis not present

## 2015-02-21 DIAGNOSIS — R42 Dizziness and giddiness: Secondary | ICD-10-CM | POA: Diagnosis not present

## 2015-02-21 DIAGNOSIS — R1110 Vomiting, unspecified: Secondary | ICD-10-CM | POA: Diagnosis not present

## 2015-02-21 DIAGNOSIS — I2510 Atherosclerotic heart disease of native coronary artery without angina pectoris: Secondary | ICD-10-CM | POA: Diagnosis not present

## 2015-02-21 DIAGNOSIS — E785 Hyperlipidemia, unspecified: Secondary | ICD-10-CM | POA: Insufficient documentation

## 2015-02-21 DIAGNOSIS — R531 Weakness: Secondary | ICD-10-CM | POA: Diagnosis not present

## 2015-02-21 LAB — URINE MICROSCOPIC-ADD ON

## 2015-02-21 LAB — URINALYSIS, ROUTINE W REFLEX MICROSCOPIC
BILIRUBIN URINE: NEGATIVE
Glucose, UA: NEGATIVE mg/dL
Ketones, ur: NEGATIVE mg/dL
NITRITE: NEGATIVE
Protein, ur: NEGATIVE mg/dL
Specific Gravity, Urine: 1.02 (ref 1.005–1.030)
Urobilinogen, UA: 0.2 mg/dL (ref 0.0–1.0)
pH: 5 (ref 5.0–8.0)

## 2015-02-21 LAB — CBC
HEMATOCRIT: 40.1 % (ref 36.0–46.0)
Hemoglobin: 13.4 g/dL (ref 12.0–15.0)
MCH: 29.9 pg (ref 26.0–34.0)
MCHC: 33.4 g/dL (ref 30.0–36.0)
MCV: 89.5 fL (ref 78.0–100.0)
Platelets: 207 10*3/uL (ref 150–400)
RBC: 4.48 MIL/uL (ref 3.87–5.11)
RDW: 13.9 % (ref 11.5–15.5)
WBC: 8 10*3/uL (ref 4.0–10.5)

## 2015-02-21 LAB — BASIC METABOLIC PANEL
Anion gap: 10 (ref 5–15)
BUN: 25 mg/dL — ABNORMAL HIGH (ref 6–23)
CALCIUM: 10.2 mg/dL (ref 8.4–10.5)
CO2: 23 mmol/L (ref 19–32)
Chloride: 106 mmol/L (ref 96–112)
Creatinine, Ser: 1.25 mg/dL — ABNORMAL HIGH (ref 0.50–1.10)
GFR, EST AFRICAN AMERICAN: 43 mL/min — AB (ref >90)
GFR, EST NON AFRICAN AMERICAN: 37 mL/min — AB (ref >90)
Glucose, Bld: 134 mg/dL — ABNORMAL HIGH (ref 70–99)
Potassium: 4.4 mmol/L (ref 3.5–5.1)
Sodium: 139 mmol/L (ref 135–145)

## 2015-02-21 LAB — PROTIME-INR
INR: 1.93 — AB (ref 0.00–1.49)
PROTHROMBIN TIME: 22.2 s — AB (ref 11.6–15.2)

## 2015-02-21 MED ORDER — MECLIZINE HCL 25 MG PO TABS
25.0000 mg | ORAL_TABLET | Freq: Once | ORAL | Status: AC
  Administered 2015-02-21: 25 mg via ORAL
  Filled 2015-02-21: qty 1

## 2015-02-21 MED ORDER — ONDANSETRON HCL 4 MG/2ML IJ SOLN
4.0000 mg | Freq: Once | INTRAMUSCULAR | Status: AC
  Administered 2015-02-21: 4 mg via INTRAVENOUS
  Filled 2015-02-21: qty 2

## 2015-02-21 MED ORDER — CEPHALEXIN 500 MG PO CAPS: 500.0000 mg | ORAL_CAPSULE | Freq: Four times a day (QID) | ORAL | Status: DC

## 2015-02-21 MED ORDER — ACETAMINOPHEN 500 MG PO TABS
1000.0000 mg | ORAL_TABLET | Freq: Once | ORAL | Status: AC
Start: 2015-02-21 — End: 2015-02-21
  Administered 2015-02-21: 1000 mg via ORAL
  Filled 2015-02-21: qty 2

## 2015-02-21 MED ORDER — ONDANSETRON 4 MG PO TBDP: ORAL_TABLET | ORAL | Status: AC

## 2015-02-21 NOTE — ED Notes (Signed)
Patient states went to bed last night at 2100.  Patient states was at baseline then.  Patient states woke up this morning with dizziness and vomiting.  Patient states she has some residual weakness from stroke last year, but doesn't feel as it has changed any from then.

## 2015-02-21 NOTE — ED Notes (Signed)
Pt verbalized understanding of d/c instructions and has no further questions.  

## 2015-02-21 NOTE — Discharge Instructions (Signed)
Dizziness °Dizziness is a common problem. It is a feeling of unsteadiness or light-headedness. You may feel like you are about to faint. Dizziness can lead to injury if you stumble or fall. A person of any age group can suffer from dizziness, but dizziness is more common in older adults. °CAUSES  °Dizziness can be caused by many different things, including: °· Middle ear problems. °· Standing for too long. °· Infections. °· An allergic reaction. °· Aging. °· An emotional response to something, such as the sight of blood. °· Side effects of medicines. °· Tiredness. °· Problems with circulation or blood pressure. °· Excessive use of alcohol or medicines, or illegal drug use. °· Breathing too fast (hyperventilation). °· An irregular heart rhythm (arrhythmia). °· A low red blood cell count (anemia). °· Pregnancy. °· Vomiting, diarrhea, fever, or other illnesses that cause body fluid loss (dehydration). °· Diseases or conditions such as Parkinson's disease, high blood pressure (hypertension), diabetes, and thyroid problems. °· Exposure to extreme heat. °DIAGNOSIS  °Your health care provider will ask about your symptoms, perform a physical exam, and perform an electrocardiogram (ECG) to record the electrical activity of your heart. Your health care provider may also perform other heart or blood tests to determine the cause of your dizziness. These may include: °· Transthoracic echocardiogram (TTE). During echocardiography, sound waves are used to evaluate how blood flows through your heart. °· Transesophageal echocardiogram (TEE). °· Cardiac monitoring. This allows your health care provider to monitor your heart rate and rhythm in real time. °· Holter monitor. This is a portable device that records your heartbeat and can help diagnose heart arrhythmias. It allows your health care provider to track your heart activity for several days if needed. °· Stress tests by exercise or by giving medicine that makes the heart beat  faster. °TREATMENT  °Treatment of dizziness depends on the cause of your symptoms and can vary greatly. °HOME CARE INSTRUCTIONS  °· Drink enough fluids to keep your urine clear or pale yellow. This is especially important in very hot weather. In older adults, it is also important in cold weather. °· Take your medicine exactly as directed if your dizziness is caused by medicines. When taking blood pressure medicines, it is especially important to get up slowly. °· Rise slowly from chairs and steady yourself until you feel okay. °· In the morning, first sit up on the side of the bed. When you feel okay, stand slowly while holding onto something until you know your balance is fine. °· Move your legs often if you need to stand in one place for a long time. Tighten and relax your muscles in your legs while standing. °· Have someone stay with you for 1-2 days if dizziness continues to be a problem. Do this until you feel you are well enough to stay alone. Have the person call your health care provider if he or she notices changes in you that are concerning. °· Do not drive or use heavy machinery if you feel dizzy. °· Do not drink alcohol. °SEEK IMMEDIATE MEDICAL CARE IF:  °· Your dizziness or light-headedness gets worse. °· You feel nauseous or vomit. °· You have problems talking, walking, or using your arms, hands, or legs. °· You feel weak. °· You are not thinking clearly or you have trouble forming sentences. It may take a friend or family member to notice this. °· You have chest pain, abdominal pain, shortness of breath, or sweating. °· Your vision changes. °· You notice   any bleeding. °· You have side effects from medicine that seems to be getting worse rather than better. °MAKE SURE YOU:  °· Understand these instructions. °· Will watch your condition. °· Will get help right away if you are not doing well or get worse. °Document Released: 05/22/2001 Document Revised: 12/01/2013 Document Reviewed: 06/15/2011 °[employer]®  Patient Information ©2015 [employer]. This information is not intended to replace advice given to you by your health care provider. Make sure you discuss any questions you have with your health care provider. ° °Urinary Tract Infection °Urinary tract infections (UTIs) can develop anywhere along your urinary tract. Your urinary tract is your body's drainage system for removing wastes and extra water. Your urinary tract includes two kidneys, two ureters, a bladder, and a urethra. Your kidneys are a pair of bean-shaped organs. Each kidney is about the size of your fist. They are located below your ribs, one on each side of your spine. °CAUSES °Infections are caused by microbes, which are microscopic organisms, including fungi, viruses, and bacteria. These organisms are so small that they can only be seen through a microscope. Bacteria are the microbes that most commonly cause UTIs. °SYMPTOMS  °Symptoms of UTIs may vary by age and gender of the patient and by the location of the infection. Symptoms in young women typically include a frequent and intense urge to urinate and a painful, burning feeling in the bladder or urethra during urination. Older women and men are more likely to be tired, shaky, and weak and have muscle aches and abdominal pain. A fever may mean the infection is in your kidneys. Other symptoms of a kidney infection include pain in your back or sides below the ribs, nausea, and vomiting. °DIAGNOSIS °To diagnose a UTI, your caregiver will ask you about your symptoms. Your caregiver also will ask to provide a urine sample. The urine sample will be tested for bacteria and white blood cells. White blood cells are made by your body to help fight infection. °TREATMENT  °Typically, UTIs can be treated with medication. Because most UTIs are caused by a bacterial infection, they usually can be treated with the use of antibiotics. The choice of antibiotic and length of treatment depend on your symptoms and  the type of bacteria causing your infection. °HOME CARE INSTRUCTIONS °· If you were prescribed antibiotics, take them exactly as your caregiver instructs you. Finish the medication even if you feel better after you have only taken some of the medication. °· Drink enough water and fluids to keep your urine clear or pale yellow. °· Avoid caffeine, tea, and carbonated beverages. They tend to irritate your bladder. °· Empty your bladder often. Avoid holding urine for long periods of time. °· Empty your bladder before and after sexual intercourse. °· After a bowel movement, women should cleanse from front to back. Use each tissue only once. °SEEK MEDICAL CARE IF:  °· You have back pain. °· You develop a fever. °· Your symptoms do not begin to resolve within 3 days. °SEEK IMMEDIATE MEDICAL CARE IF:  °· You have severe back pain or lower abdominal pain. °· You develop chills. °· You have nausea or vomiting. °· You have continued burning or discomfort with urination. °MAKE SURE YOU:  °· Understand these instructions. °· Will watch your condition. °· Will get help right away if you are not doing well or get worse. °Document Released: 09/05/2005 Document Revised: 05/27/2012 Document Reviewed: 01/04/2012 °ExitCare® Patient Information ©2015 [employer]. This information is not   to replace advice given to you by your health care provider. Make sure you discuss any questions you have with your health care provider. ° °

## 2015-02-21 NOTE — ED Provider Notes (Signed)
CSN: [id number]     Arrival date & time 02/21/15  1355 History   First MD Initiated Contact with Patient 02/21/15 1602     Chief Complaint  Patient presents with  . Dizziness  . Emesis     (Consider location/radiation/quality/duration/timing/severity/associated sxs/prior Treatment) Patient is a 81 y.o. female presenting with dizziness.  Dizziness Quality:  Imbalance Severity:  Moderate Onset quality:  Unable to specify Duration:  1 day Timing:  Constant Progression:  Unchanged Chronicity:  New Context comment:  Prior cerebellar CVA Relieved by:  Being still Worsened by:  Nothing Associated symptoms: blood in stool (hemorrhoids), nausea and vomiting   Associated symptoms: no headaches     Past Medical History  Diagnosis Date  . CAD (coronary artery disease)   . Factor V Leiden   . GERD (gastroesophageal reflux disease)   . Multinodular goiter   . Internal hemorrhoids   . Hyperlipidemia   . Hypertension   . Nephrolithiasis   . Osteopenia   . Phlebitis   . PVD (peripheral vascular disease)   . Varicose vein   . Vitamin D deficiency   . DDD (degenerative disc disease), lumbar   . Blood transfusion without reported diagnosis   . Clotting disorder   . Stroke 10/2014   Past Surgical History  Procedure Laterality Date  . Total abdominal hysterectomy    . Bladder repair    . Coronary artery bypass graft    . Coronary artery bypass graft  2004    lima-lad,rima-rca,lradial-om1&2  . Breast biopsy     Family History  Problem Relation Age of Onset  . Coronary artery disease Mother   . Heart disease Mother   . Alzheimer's disease Sister   . Heart disease Brother   . Heart attack Brother   . Heart attack Brother    History  Substance Use Topics  . Smoking status: Never Smoker   . Smokeless tobacco: Never Used  . Alcohol Use: Yes     Comment: occ   OB History    No data available     Review of Systems  Gastrointestinal: Positive for nausea, vomiting and  blood in stool (hemorrhoids).  Neurological: Positive for dizziness. Negative for headaches.  All other systems reviewed and are negative.     Allergies  Ivp dye  Home Medications   Prior to Admission medications   Medication Sig Start Date End Date Taking? Authorizing Provider  calcium-vitamin D (OSCAL WITH D) 500-200 MG-UNIT per tablet Take 1 tablet by mouth 2 (two) times daily.    Yes Historical Provider, MD  cephALEXin (KEFLEX) 500 MG capsule Take 1 capsule (500 mg total) by mouth 4 (four) times daily. 02/21/15   Debby Freiberg, MD  Cholecalciferol (VITAMIN D) 2000 UNITS CAPS Take 1 capsule by mouth daily.     Yes Historical Provider, MD  cyclobenzaprine (FLEXERIL) 5 MG tablet Take 5 mg by mouth 2 (two) times daily as needed for muscle spasms.   Yes Historical Provider, MD  hydrochlorothiazide (HYDRODIURIL) 25 MG tablet Take 25 mg by mouth daily.     Yes Historical Provider, MD  hydrocortisone (ANUSOL-HC) 25 MG suppository  01/11/14  Yes Historical Provider, MD  Misc Natural Products (OSTEO BI-FLEX ADV DOUBLE ST) CAPS Take 2 capsules by mouth daily.     Yes Historical Provider, MD  Multiple Vitamins-Minerals (MULTIVITAMIN WITH MINERALS) tablet Take 1 tablet by mouth daily.     Yes Historical Provider, MD  nitroGLYCERIN (NITROSTAT) 0.4 MG SL tablet Place 1  tablet (0.4 mg total) under the tongue every 5 (five) minutes as needed for chest pain. 01/12/14  Yes Peter M Martinique, MD  olmesartan (BENICAR) 20 MG tablet Take 20 mg by mouth daily.     Yes Historical Provider, MD  Omega-3 Fatty Acids (FISH OIL) 1000 MG CAPS Take 1,000 mg by mouth daily.    Yes Historical Provider, MD  ondansetron (ZOFRAN ODT) 4 MG disintegrating tablet 4mg  ODT q4 hours prn nausea/vomit 02/21/15   Debby Freiberg, MD  pantoprazole (PROTONIX) 40 MG tablet Take 40 mg by mouth daily.     Yes Historical Provider, MD  potassium chloride SA (K-DUR,KLOR-CON) 20 MEQ tablet Take 20 mEq by mouth daily.     Yes Historical Provider, MD   simvastatin (ZOCOR) 40 MG tablet Take 40 mg by mouth at bedtime.     Yes Historical Provider, MD  traMADol (ULTRAM) 50 MG tablet Take 1 tablet (50 mg total) by mouth every 6 (six) hours as needed. 10/25/14  Yes Verlee Monte, MD  warfarin (COUMADIN) 3 MG tablet Take 3 mg by mouth daily. 3 mg every day   Yes Historical Provider, MD   BP 158/73 mmHg  Pulse 69  Temp(Src) 98.2 F (36.8 C) (Oral)  Resp 16  Ht 5\' 2"  (1.575 m)  Wt 129 lb (58.514 kg)  BMI 23.59 kg/m2  SpO2 97% Physical Exam  Constitutional: She is oriented to person, place, and time. She appears well-developed and well-nourished.  HENT:  Head: Normocephalic and atraumatic.  Right Ear: External ear normal.  Left Ear: External ear normal.  Eyes: Conjunctivae and EOM are normal. Pupils are equal, round, and reactive to light.  Neck: Normal range of motion. Neck supple.  Cardiovascular: Normal rate, regular rhythm, normal heart sounds and intact distal pulses.   Pulmonary/Chest: Effort normal and breath sounds normal.  Abdominal: Soft. Bowel sounds are normal. There is no tenderness.  Musculoskeletal: Normal range of motion.  Neurological: She is alert and oriented to person, place, and time. She has normal strength and normal reflexes. No cranial nerve deficit or sensory deficit. Gait (ataxic) abnormal. Coordination normal. GCS eye subscore is 4. GCS verbal subscore is 5. GCS motor subscore is 6.  Skin: Skin is warm and dry.  Vitals reviewed.   ED Course  Procedures (including critical care time) Labs Review Labs Reviewed  BASIC METABOLIC PANEL - Abnormal; Notable for the following:    Glucose, Bld 134 (*)    BUN 25 (*)    Creatinine, Ser 1.25 (*)    GFR calc non Af Amer 37 (*)    GFR calc Af Amer 43 (*)    All other components within normal limits  URINALYSIS, ROUTINE W REFLEX MICROSCOPIC - Abnormal; Notable for the following:    Hgb urine dipstick TRACE (*)    Leukocytes, UA SMALL (*)    All other components  within normal limits  URINE MICROSCOPIC-ADD ON - Abnormal; Notable for the following:    Casts HYALINE CASTS (*)    All other components within normal limits  PROTIME-INR - Abnormal; Notable for the following:    Prothrombin Time 22.2 (*)    INR 1.93 (*)    All other components within normal limits  CBC  PROTIME-INR  TROPONIN I    Imaging Review Dg Chest 2 View  02/21/2015   CLINICAL DATA:  Dizziness, weakness and vomiting.  EXAM: CHEST - 2 VIEW  COMPARISON:  10/21/2014  FINDINGS: The heart size is stable and within normal  limits following prior CABG. Stable low volumes with chronic elevation of the right hemidiaphragm. There is no evidence of pulmonary edema, consolidation, pneumothorax, nodule or pleural fluid. The visualized skeletal structures are unremarkable.  IMPRESSION: No active disease.   Electronically Signed   By: Aletta Edouard M.D.   On: 02/21/2015 16:52   Ct Head Wo Contrast  02/21/2015   CLINICAL DATA:  Dizziness and nausea beginning today.  EXAM: CT HEAD WITHOUT CONTRAST  TECHNIQUE: Contiguous axial images were obtained from the base of the skull through the vertex without intravenous contrast.  COMPARISON:  Head CT scan and brain MRI 10/21/2014.  FINDINGS: Encephalomalacia in the right cerebellum is consistent with evolutionary change in the previously seen infarct. There is chronic microvascular ischemic change. No evidence of acute infarction, hemorrhage, mass lesion, mass effect, midline shift or abnormal extra-axial fluid collections identified. There is no hydrocephalus or pneumocephalus. The calvarium is intact.  IMPRESSION: No acute abnormality.  Chronic microvascular ischemic change and remote right cerebellar infarct.   Electronically Signed   By: Inge Rise M.D.   On: 02/21/2015 15:44   Mr Brain Wo Contrast  02/21/2015   CLINICAL DATA:  Patient awoke with dizziness and vomiting. History of prior cerebral infarction. Initial encounter.  EXAM: MRI HEAD WITHOUT  CONTRAST  TECHNIQUE: Multiplanar, multiecho pulse sequences of the brain and surrounding structures were obtained without intravenous contrast.  COMPARISON:  MR brain 10/21/2014.  CT head 02/21/2015.  FINDINGS: No evidence for acute infarction, hemorrhage, mass lesion, hydrocephalus, or extra-axial fluid. Generalized cerebral and cerebellar atrophy. Extensive small vessel disease throughout the periventricular and subcortical white matter, with minor changes in the brainstem.  Large area of remote cerebral infarction affects the RIGHT inferior cerebellum predominantly RIGHT PICA territory. Flow voids are maintained in the carotid, basilar, and LEFT greater than RIGHT vertebral arteries. Specifically the RIGHT PICA is patent.  No foci of chronic hemorrhage. Extracranial soft tissues unremarkable. No sinus air-fluid levels.  Compared with prior CT there is good general agreement. Compared with prior MR, the infarct has resolved.  IMPRESSION: Chronic RIGHT cerebellar infarct. No acute intracranial findings. No new areas of cerebellar, brainstem, or cerebral infarction.  Advanced atrophy and small vessel disease.   Electronically Signed   By: Rolla Flatten M.D.   On: 02/21/2015 20:51     EKG Interpretation   Date/Time:  Monday February 21 2015 14:16:03 EDT Ventricular Rate:  78 PR Interval:  156 QRS Duration: 116 QT Interval:  388 QTC Calculation: 442 R Axis:   -19 Text Interpretation:  Normal sinus rhythm Incomplete right bundle branch  block ST \\T \ T wave abnormality, consider anterior ischemia Abnormal ECG  No significant change since last tracing Confirmed by Debby Freiberg  [phone]) on 02/21/2015 4:04:34 PM      MDM   Final diagnoses:  Ataxia  Non-intractable vomiting with nausea, vomiting of unspecified type  Cystitis    81 y.o. female with pertinent PMH of prior cerebellar CVA, Factor v leiden on coumadin presents with recurrent ataxia and dizziness. Patient was last normal last night prior  to going to bed. This morning she awoke with "feeling drunk".  On arrival today vitals signs and physical exam as above. Patient does have ataxia, states that she is not normally ataxic after prior CVA.  No clear signs of peripheral etiology on exam.  Obtained MR to ro posterior fossa pathology which could be new.  This was unremarkable. INR subtherapeutic.  Pt had prior ICA stenosis, however doubt  new vascular pathology given [place] with clear verts on prior.  DC home to fu with home neurologist, pt ambulated after meclizine and fluids without difficulty.  Given vantin for UTI.    I have reviewed all laboratory and imaging studies if ordered as above  1. Non-intractable vomiting with nausea, vomiting of unspecified type   2. Ataxia   3. Cystitis         Debby Freiberg, MD 02/22/15 [phone]

## 2015-02-24 DIAGNOSIS — I6990 Unspecified sequelae of unspecified cerebrovascular disease: Secondary | ICD-10-CM | POA: Diagnosis not present

## 2015-02-24 DIAGNOSIS — Z6823 Body mass index (BMI) 23.0-23.9, adult: Secondary | ICD-10-CM | POA: Diagnosis not present

## 2015-02-24 DIAGNOSIS — N390 Urinary tract infection, site not specified: Secondary | ICD-10-CM | POA: Diagnosis not present

## 2015-02-24 DIAGNOSIS — Z7901 Long term (current) use of anticoagulants: Secondary | ICD-10-CM | POA: Diagnosis not present

## 2015-02-24 DIAGNOSIS — I2510 Atherosclerotic heart disease of native coronary artery without angina pectoris: Secondary | ICD-10-CM | POA: Diagnosis not present

## 2015-02-24 DIAGNOSIS — D682 Hereditary deficiency of other clotting factors: Secondary | ICD-10-CM | POA: Diagnosis not present

## 2015-02-24 DIAGNOSIS — I10 Essential (primary) hypertension: Secondary | ICD-10-CM | POA: Diagnosis not present

## 2015-03-02 DIAGNOSIS — E785 Hyperlipidemia, unspecified: Secondary | ICD-10-CM | POA: Diagnosis not present

## 2015-03-02 DIAGNOSIS — M810 Age-related osteoporosis without current pathological fracture: Secondary | ICD-10-CM | POA: Diagnosis not present

## 2015-03-02 DIAGNOSIS — E042 Nontoxic multinodular goiter: Secondary | ICD-10-CM | POA: Diagnosis not present

## 2015-03-02 DIAGNOSIS — I10 Essential (primary) hypertension: Secondary | ICD-10-CM | POA: Diagnosis not present

## 2015-03-02 DIAGNOSIS — N390 Urinary tract infection, site not specified: Secondary | ICD-10-CM | POA: Diagnosis not present

## 2015-03-02 DIAGNOSIS — Z008 Encounter for other general examination: Secondary | ICD-10-CM | POA: Diagnosis not present

## 2015-03-02 DIAGNOSIS — I6529 Occlusion and stenosis of unspecified carotid artery: Secondary | ICD-10-CM | POA: Diagnosis not present

## 2015-03-02 DIAGNOSIS — R739 Hyperglycemia, unspecified: Secondary | ICD-10-CM | POA: Diagnosis not present

## 2015-03-17 DIAGNOSIS — Z6824 Body mass index (BMI) 24.0-24.9, adult: Secondary | ICD-10-CM | POA: Diagnosis not present

## 2015-03-17 DIAGNOSIS — Z1389 Encounter for screening for other disorder: Secondary | ICD-10-CM | POA: Diagnosis not present

## 2015-03-17 DIAGNOSIS — G4700 Insomnia, unspecified: Secondary | ICD-10-CM | POA: Diagnosis not present

## 2015-03-17 DIAGNOSIS — Z0000 Encounter for general adult medical examination without abnormal findings: Secondary | ICD-10-CM | POA: Diagnosis not present

## 2015-03-17 DIAGNOSIS — R739 Hyperglycemia, unspecified: Secondary | ICD-10-CM | POA: Diagnosis not present

## 2015-03-17 DIAGNOSIS — I6990 Unspecified sequelae of unspecified cerebrovascular disease: Secondary | ICD-10-CM | POA: Diagnosis not present

## 2015-03-17 DIAGNOSIS — E785 Hyperlipidemia, unspecified: Secondary | ICD-10-CM | POA: Diagnosis not present

## 2015-03-17 DIAGNOSIS — Z7901 Long term (current) use of anticoagulants: Secondary | ICD-10-CM | POA: Diagnosis not present

## 2015-03-17 DIAGNOSIS — N183 Chronic kidney disease, stage 3 (moderate): Secondary | ICD-10-CM | POA: Diagnosis not present

## 2015-03-17 DIAGNOSIS — R627 Adult failure to thrive: Secondary | ICD-10-CM | POA: Diagnosis not present

## 2015-03-17 DIAGNOSIS — I739 Peripheral vascular disease, unspecified: Secondary | ICD-10-CM | POA: Diagnosis not present

## 2015-03-17 DIAGNOSIS — I6529 Occlusion and stenosis of unspecified carotid artery: Secondary | ICD-10-CM | POA: Diagnosis not present

## 2015-03-24 ENCOUNTER — Other Ambulatory Visit: Payer: Self-pay | Admitting: *Deleted

## 2015-03-24 ENCOUNTER — Encounter: Payer: Self-pay | Admitting: Vascular Surgery

## 2015-03-24 DIAGNOSIS — I6990 Unspecified sequelae of unspecified cerebrovascular disease: Secondary | ICD-10-CM

## 2015-03-24 DIAGNOSIS — I6521 Occlusion and stenosis of right carotid artery: Secondary | ICD-10-CM

## 2015-03-30 DIAGNOSIS — D6851 Activated protein C resistance: Secondary | ICD-10-CM | POA: Diagnosis not present

## 2015-03-30 DIAGNOSIS — Z7901 Long term (current) use of anticoagulants: Secondary | ICD-10-CM | POA: Diagnosis not present

## 2015-03-30 DIAGNOSIS — I6990 Unspecified sequelae of unspecified cerebrovascular disease: Secondary | ICD-10-CM | POA: Diagnosis not present

## 2015-03-31 ENCOUNTER — Encounter: Payer: Self-pay | Admitting: Vascular Surgery

## 2015-04-01 ENCOUNTER — Ambulatory Visit (HOSPITAL_COMMUNITY)
Admission: RE | Admit: 2015-04-01 | Discharge: 2015-04-01 | Disposition: A | Discharge status: Home / Self Care | Payer: Medicare Other | Source: Ambulatory Visit | Attending: Vascular Surgery | Admitting: Vascular Surgery

## 2015-04-01 ENCOUNTER — Ambulatory Visit (INDEPENDENT_AMBULATORY_CARE_PROVIDER_SITE_OTHER): Payer: Medicare Other | Admitting: Vascular Surgery

## 2015-04-01 ENCOUNTER — Encounter: Payer: Self-pay | Admitting: Vascular Surgery

## 2015-04-01 VITALS — BP 140/82 | HR 80 | Temp 97.5°F | Resp 16 | Ht 62.0 in | Wt 128.0 lb

## 2015-04-01 DIAGNOSIS — I6521 Occlusion and stenosis of right carotid artery: Secondary | ICD-10-CM

## 2015-04-01 DIAGNOSIS — I6990 Unspecified sequelae of unspecified cerebrovascular disease: Secondary | ICD-10-CM | POA: Diagnosis not present

## 2015-04-01 NOTE — Progress Notes (Signed)
Vascular and Vein Specialist of [facility]  Patient name: Heather Alvarado MRN: [id number] DOB: 1923/06/18 Sex: female  REASON FOR CONSULT: Carotid disease. Referred by Dr. Virgina Jock.   HPI: Heather Alvarado is a 81 y.o. 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 Of note carotid duplex scan did not show any evidence of vertebral basilar disease. She does however have a factor V Leiden mutation and is on chronic Coumadin therapy.  The patient is right-handed. She denies any history of stroke, TIAs, expressive or receptive aphasia, or amaurosis fugax.  I have reviewed the records from [facility]. The patient did have a carotid Doppler study done on 03/02/2015 which suggested possibly a greater than 70% right carotid stenosis with no significant stenosis on the left.  The patient has essential hypertension which has been stable. Her hyperlipidemia is under good control on Zocor. She has coronary artery disease but is asymptomatic.  Past Medical History  Diagnosis Date  . CAD (coronary artery disease)   . Factor V Leiden   . GERD (gastroesophageal reflux disease)   . Multinodular goiter   . Internal hemorrhoids   . Hyperlipidemia   . Hypertension   . Nephrolithiasis   . Osteopenia   . Phlebitis   . PVD (peripheral vascular disease)   . Varicose vein   . Vitamin D deficiency   . DDD (degenerative disc disease), lumbar   . Blood transfusion without reported diagnosis   . Clotting disorder   . Stroke 10/2014   Family History  Problem Relation Age of Onset  . Coronary artery disease Mother   . Heart disease Mother   . Alzheimer's disease Sister   . Heart disease Brother   . Heart attack Brother   . Heart attack Brother    SOCIAL HISTORY: History  Substance Use Topics  . Smoking status: Never Smoker   . Smokeless tobacco: Never Used  . Alcohol Use: Yes     Comment: occ     Allergies  Allergen Reactions  . Ivp Dye [Iodinated Diagnostic Agents] Hives   Current Outpatient Prescriptions  Medication Sig Dispense Refill  . calcium-vitamin D (OSCAL WITH D) 500-200 MG-UNIT per tablet Take 1 tablet by mouth 2 (two) times daily.     . Cholecalciferol (VITAMIN D) 2000 UNITS CAPS Take 1 capsule by mouth daily.      . cyclobenzaprine (FLEXERIL) 5 MG tablet Take 5 mg by mouth 2 (two) times daily as needed for muscle spasms.    . hydrochlorothiazide (HYDRODIURIL) 25 MG tablet Take 25 mg by mouth daily.      . hydrocortisone (ANUSOL-HC) 25 MG suppository     . Misc Natural Products (OSTEO BI-FLEX ADV DOUBLE ST) CAPS Take 2 capsules by mouth daily.      . Multiple Vitamins-Minerals (MULTIVITAMIN WITH MINERALS) tablet Take 1 tablet by mouth daily.      . nitroGLYCERIN (NITROSTAT) 0.4 MG SL tablet Place 1 tablet (0.4 mg total) under the tongue every 5 (five) minutes as needed for chest pain. 90 tablet 3  . olmesartan (BENICAR) 20 MG tablet Take 20 mg by mouth daily.      . Omega-3 Fatty Acids (FISH OIL) 1000 MG CAPS Take 1,000 mg by mouth daily.     . ondansetron (ZOFRAN ODT) 4 MG disintegrating tablet 4mg  ODT q4 hours prn nausea/vomit 15 tablet 0  . pantoprazole (PROTONIX) 40 MG tablet  Take 40 mg by mouth daily.      . potassium chloride SA (K-DUR,KLOR-CON) 20 MEQ tablet Take 20 mEq by mouth daily.      . simvastatin (ZOCOR) 40 MG tablet Take 40 mg by mouth at bedtime.      . traMADol (ULTRAM) 50 MG tablet Take 1 tablet (50 mg total) by mouth every 6 (six) hours as needed. 10 tablet 0  . warfarin (COUMADIN) 3 MG tablet Take 3 mg by mouth daily. 3 mg every day     No current facility-administered medications for this visit.   REVIEW OF SYSTEMS: Valu.Nieves ] denotes positive finding; [  ] denotes negative finding  CARDIOVASCULAR:  [ ]  chest pain   [ ]  chest pressure   [ ]  palpitations   [ ]  orthopnea   [ ]  dyspnea on exertion   Valu.Nieves ] claudication   [ ]  rest pain   [ ]  DVT   [ ]   phlebitis PULMONARY:   [ ]  productive cough   [ ]  asthma   [ ]  wheezing NEUROLOGIC:   [ ]  weakness  [ ]  paresthesias  [ ]  aphasia  [ ]  amaurosis  [ ]  dizziness HEMATOLOGIC:   [ ]  bleeding problems   Valu.Nieves ] clotting disorders- factor V Leiden on chronic Coumadin therapy MUSCULOSKELETAL:  [ ]  joint pain   [ ]  joint swelling Valu.Nieves ] leg swelling right leg (h/o DVT) GASTROINTESTINAL: [ ]   blood in stool  [ ]   hematemesis GENITOURINARY:  [ ]   dysuria  [ ]   hematuria PSYCHIATRIC:  [ ]  history of major depression INTEGUMENTARY:  [ ]  rashes  [ ]  ulcers CONSTITUTIONAL:  [ ]  fever   [ ]  chills  PHYSICAL EXAM: Filed Vitals:   04/01/15 1105 04/01/15 1109  BP: 136/69 140/82  Pulse: 76 80  Temp: 97.5 F (36.4 C)   TempSrc: Oral   Resp: 16   Height: 5\' 2"  (1.575 m)   Weight: 128 lb (58.06 kg)   SpO2: 98%    GENERAL: The patient is a well-nourished female, in no acute distress. The vital signs are documented above. CARDIOVASCULAR: There is a regular rate and rhythm. I do not detect carotid bruits. She has palpable femoral pulses and palpable pedal pulses on the left. I cannot palpate pedal pulses on the right although both feet are warm and well-perfused. She has mild right lower extremity swelling. PULMONARY: There is good air exchange bilaterally without wheezing or rales. ABDOMEN: Soft and non-tender with normal pitched bowel sounds. I do not palpate an abdominal aortic aneurysm. MUSCULOSKELETAL: There are no major deformities or cyanosis. NEUROLOGIC: No focal weakness or paresthesias are detected. SKIN: There are no ulcers or rashes noted. PSYCHIATRIC: The patient has a normal affect.  DATA:  I have independently interpreted the limited carotid duplex scan in our office today of the right carotid system. On the right side patient has evidence of a 40-59% right carotid stenosis. The right vertebral artery is patent with antegrade flow.  MEDICAL ISSUES:  MODERATE RIGHT CAROTID STENOSIS: By duplex  the patient has a moderate right carotid stenosis which is asymptomatic. Her vertebral arteries are patent with antegrade flow in she has no evidence of vertebral basilar disease. Thus I think the moderate right carotid stenosis is asymptomatic. I explained that we typically would not consider carotid endarterectomy in an asymptomatic patient unless the stenosis progressed to greater than 80%. I have recommended a follow carotid duplex scan in 6 months and  I will see her back at that time. If there is been no significant change at that point that I think she could simply have a yearly follow up carotid duplex scan. He is on chronic Coumadin therapy because of her factor V Leiden mutation. This reason she is not on aspirin. He is on a statin. I will see her back in 6 months. She knows to call sooner if she has problems.   [place] [facility] [place] [facility]: [phone]

## 2015-04-05 NOTE — Addendum Note (Signed)
Addended by: Mena Goes on: 04/05/2015 03:34 PM   Modules accepted: Orders

## 2015-04-06 ENCOUNTER — Encounter: Payer: Self-pay | Admitting: Internal Medicine

## 2015-04-27 DIAGNOSIS — Z7901 Long term (current) use of anticoagulants: Secondary | ICD-10-CM | POA: Diagnosis not present

## 2015-04-27 DIAGNOSIS — D6851 Activated protein C resistance: Secondary | ICD-10-CM | POA: Diagnosis not present

## 2015-04-27 DIAGNOSIS — I63011 Cerebral infarction due to thrombosis of right vertebral artery: Secondary | ICD-10-CM | POA: Diagnosis not present

## 2015-05-06 ENCOUNTER — Emergency Department
Admission: EM | Admit: 2015-05-06 | Discharge: 2015-05-06 | Disposition: A | Discharge status: Home / Self Care | Payer: Medicare Other | Attending: Emergency Medicine | Admitting: Emergency Medicine

## 2015-05-06 ENCOUNTER — Encounter: Payer: Self-pay | Admitting: Emergency Medicine

## 2015-05-06 DIAGNOSIS — Z7901 Long term (current) use of anticoagulants: Secondary | ICD-10-CM | POA: Insufficient documentation

## 2015-05-06 DIAGNOSIS — J36 Peritonsillar abscess: Secondary | ICD-10-CM

## 2015-05-06 DIAGNOSIS — J029 Acute pharyngitis, unspecified: Secondary | ICD-10-CM | POA: Diagnosis present

## 2015-05-06 DIAGNOSIS — I10 Essential (primary) hypertension: Secondary | ICD-10-CM | POA: Insufficient documentation

## 2015-05-06 DIAGNOSIS — Z79899 Other long term (current) drug therapy: Secondary | ICD-10-CM | POA: Diagnosis not present

## 2015-05-06 MED ORDER — CLINDAMYCIN HCL 150 MG PO CAPS
300.0000 mg | ORAL_CAPSULE | Freq: Once | ORAL | Status: AC
  Administered 2015-05-06: 300 mg via ORAL

## 2015-05-06 MED ORDER — CLINDAMYCIN HCL 300 MG PO CAPS: 300.0000 mg | ORAL_CAPSULE | Freq: Three times a day (TID) | ORAL | Status: AC

## 2015-05-06 MED ORDER — CLINDAMYCIN HCL 150 MG PO CAPS
ORAL_CAPSULE | ORAL | Status: AC
  Administered 2015-05-06: 300 mg via ORAL
  Filled 2015-05-06: qty 2

## 2015-05-06 MED ORDER — DEXAMETHASONE 6 MG PO TABS
10.0000 mg | ORAL_TABLET | Freq: Once | ORAL | Status: AC
  Administered 2015-05-06: 10 mg via ORAL
  Filled 2015-05-06: qty 1

## 2015-05-06 NOTE — Discharge Instructions (Signed)
Take clindamycin as prescribed. This was discussed with Dr. Sheppard Coil, ENT. If you feel this swollen area is getting bigger please return to the emergency department immediately. Return if you have any other urgent concerns. Follow-up with [facility] on Tuesday or Wednesday.   Peritonsillar Abscess Peritonsillar abscess is a collection of yellowish white fluid (pus) in the back of the throat. This fluid forms behind the tonsils. The treatment is most often drainage. This is done by:  Putting a needle into the abscess.  Cutting and draining the abscess. HOME CARE  If your abscess was drained today:  Mix 1 teaspoon of salt in 8 ounces of warm water for gargling.  Gargle the warm salt water.  Gargle 4 times per day or as needed for comfort. Do not swallow this mixture.  Rest in bed as needed.  Return to normal activity as soon as you can.  Apply cold to your neck for pain relief.  Put ice in a plastic bag.  Place a towel between your skin and the bag.  Leave the ice on for 15-20 minutes at a time, 03-04 times a day.  Eat a soft or liquid diet. Drink cold fluids. Cold fluids will soothe and take puffiness (swelling) down.  Only take medicine as told by your doctor.  Take all medicine as told. GET HELP RIGHT AWAY IF:   You are coughing up or throwing up (vomiting) blood.  Your throat pain is severe and pain medicine does not help it.  You have trouble talking or breathing.  You have trouble swallowing or eating.  You find it easier to breathe while leaning forward.  You have pain that gets worse.  You have pain, puffiness, redness, or drainage in your throat.  You have a fever.  You become dizzy, have a headache, have low energy, or feel sick.  You have signs of body fluid loss (dehydration). This includes lightheadedness when standing, peeing (urinating) less, a fast heart rate, or dry mouth and nose.  You start to drool. MAKE SURE YOU:  Understand these  instructions.  Will watch your condition.  Will get help if you are not doing well or get worse. Document Released: 11/14/2009 Document Revised: 02/18/2012 Document Reviewed: 11/14/2009 [facility] Patient Information 2015 [place], [place]. This information is not intended to replace advice given to you by your health care provider. Make sure you discuss any questions you have with your health care provider.

## 2015-05-06 NOTE — ED Notes (Signed)
Pt presents to ED with c/o sore throat since Sunday. Pt reports that she was just seen at [facility] and was sent to ED to rule out a peritonsillar abscess. Pt is awake and alert, speaking in complete sentences without difficulty during triage.

## 2015-05-06 NOTE — ED Provider Notes (Signed)
[facility] Emergency Department Provider Note  ____________________________________________  Time seen: Seen by me at 1815 in triage  I have reviewed the triage vital signs and the nursing notes.   HISTORY  Chief Complaint Sore Throat  evaluate for peritonsillar abscess per urgent care    HPI Heather Alvarado is a 81 y.o. female who reports she began to have some throat discomfort earlier this week. It has continued. [facility]. They were concerned she might have a peritonsillar abscess and she was sent here for further evaluation.  The patient reports that she has some discomfort when she swallows. Her voice is overall normal. She denies any fevers.     Past Medical History  Diagnosis Date  . CAD (coronary artery disease)   . Factor V Leiden   . GERD (gastroesophageal reflux disease)   . Multinodular goiter   . Internal hemorrhoids   . Hyperlipidemia   . Hypertension   . Nephrolithiasis   . Osteopenia   . Phlebitis   . PVD (peripheral vascular disease)   . Varicose vein   . Vitamin D deficiency   . DDD (degenerative disc disease), lumbar   . Blood transfusion without reported diagnosis   . Clotting disorder   . Stroke 10/2014    Patient Active Problem List   Diagnosis Date Noted  . 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 Essential hypertension 01/04/2015  . HLD (hyperlipidemia) 01/04/2015  . Factor V Leiden mutation 01/04/2015  . Chronic anticoagulation 01/04/2015  . Dizziness   . CVA (cerebral infarction) 10/21/2014  . PVD (peripheral vascular disease)   . CAD (coronary artery disease)   . Factor V Leiden   . Hyperlipidemia   . Hypertension     Past Surgical History  Procedure Laterality Date  . Total abdominal hysterectomy    . Bladder repair    . Coronary artery bypass graft    . Coronary artery bypass graft  2004    lima-lad,rima-rca,lradial-om1&2   . Breast biopsy      Current Outpatient Rx  Name  Route  Sig  Dispense  Refill  . calcium-vitamin D (OSCAL WITH D) 500-200 MG-UNIT per tablet   Oral   Take 1 tablet by mouth 2 (two) times daily.          . Cholecalciferol (VITAMIN D) 2000 UNITS CAPS   Oral   Take 1 capsule by mouth daily.           . clindamycin (CLEOCIN) 300 MG capsule   Oral   Take 1 capsule (300 mg total) by mouth 3 (three) times daily.   30 capsule   0   . cyclobenzaprine (FLEXERIL) 5 MG tablet   Oral   Take 5 mg by mouth 2 (two) times daily as needed for muscle spasms.         . hydrochlorothiazide (HYDRODIURIL) 25 MG tablet   Oral   Take 25 mg by mouth daily.           . hydrocortisone (ANUSOL-HC) 25 MG suppository               . Misc Natural Products (OSTEO BI-FLEX ADV DOUBLE ST) CAPS   Oral   Take 2 capsules by mouth daily.           . Multiple Vitamins-Minerals (MULTIVITAMIN WITH MINERALS) tablet   Oral   Take 1 tablet by mouth daily.           Marland Kitchen  nitroGLYCERIN (NITROSTAT) 0.4 MG SL tablet   Sublingual   Place 1 tablet (0.4 mg total) under the tongue every 5 (five) minutes as needed for chest pain.   90 tablet   3   . olmesartan (BENICAR) 20 MG tablet   Oral   Take 20 mg by mouth daily.           . Omega-3 Fatty Acids (FISH OIL) 1000 MG CAPS   Oral   Take 1,000 mg by mouth daily.          . ondansetron (ZOFRAN ODT) 4 MG disintegrating tablet      4mg  ODT q4 hours prn nausea/vomit   15 tablet   0   . pantoprazole (PROTONIX) 40 MG tablet   Oral   Take 40 mg by mouth daily.           . potassium chloride SA (K-DUR,KLOR-CON) 20 MEQ tablet   Oral   Take 20 mEq by mouth daily.           . simvastatin (ZOCOR) 40 MG tablet   Oral   Take 40 mg by mouth at bedtime.           . traMADol (ULTRAM) 50 MG tablet   Oral   Take 1 tablet (50 mg total) by mouth every 6 (six) hours as needed.   10 tablet   0   . warfarin (COUMADIN) 3 MG tablet   Oral   Take  3 mg by mouth daily. 3 mg every day           Allergies Ivp dye  Family History  Problem Relation Age of Onset  . Coronary artery disease Mother   . Heart disease Mother   . Alzheimer's disease Sister   . Heart disease Brother   . Heart attack Brother   . Heart attack Brother     Social History History  Substance Use Topics  . Smoking status: Never Smoker   . Smokeless tobacco: Never Used  . Alcohol Use: No     Comment: occ    Review of Systems  Constitutional: Negative for fever. ENT: Positive for sore throat. See history of present illness Cardiovascular: Negative for chest pain. Respiratory: Negative for shortness of breath. Gastrointestinal: Negative for abdominal pain, vomiting and diarrhea. Genitourinary: Negative for dysuria. Musculoskeletal: Negative for back pain. Skin: Negative for rash. Neurological: Negative for headaches Hematology: Notable for factor V Leiden deficiency and on Coumadin. She was just seen by her primary care physician earlier this week with INR measured.   10-point ROS otherwise negative.  ____________________________________________   PHYSICAL EXAM:  VITAL SIGNS: ED Triage Vitals  Enc Vitals Group     BP 05/06/15 1759 179/63 mmHg     Pulse Rate 05/06/15 1759 85     Resp 05/06/15 1759 20     Temp 05/06/15 1759 98.3 F (36.8 C)     Temp Source 05/06/15 1759 Oral     SpO2 05/06/15 1759 99 %     Weight --      Height 05/06/15 1759 5\' 2"  (1.575 m)     Head Cir --      Peak Flow --      Pain Score 05/06/15 1759 7     Pain Loc --      Pain Edu? --      Excl. in [place]? --     Constitutional:  Alert and oriented. Well appearing and in no distress. ENT   Head:  Normocephalic and atraumatic.   Nose: No congestion/rhinnorhea.   Mouth/Throat: There is the appearance of a small peritonsillar abscess in the posterior right portion of her oropharynx. Mild erythema and mild swelling. The remainder of the exam appears  normal Cardiovascular: Normal rate, regular rhythm. Respiratory: Normal respiratory effort without tachypnea. Breath sounds are clear and equal bilaterally. No wheezes/rales/rhonchi. Gastrointestinal: Soft and nontender. No distention.  Musculoskeletal: Nontender with normal range of motion in all extremities.  No noted edema. Neurologic:  Normal speech and language. No gross focal neurologic deficits are appreciated.  Skin:  Skin is warm, dry. No rash noted. Psychiatric: Mood and affect are normal. Speech and behavior are normal.  ____________________________________________    INITIAL IMPRESSION / ASSESSMENT AND PLAN / ED COURSE  This patient has a small peritonsillar abscess. If she was not on Coumadin it may be worse attempting a needle aspiration, given the fact that she is on Coumadin and the abscess is small, the risks may outweigh the benefits. I called the on-call ENT surgeon, Dr. Sheppard Coil, to discuss the case. He agreed with my thoughts and concerns for a needle aspiration or other intervention and suggested I had in mind, which is 2 Place the patient on antibiotics and have her return to the emergency Department if things are getting worse or she has any other concerns. He also advised to place the patient on steroids to cool down some of the inflammation.  ____________________________________________   FINAL CLINICAL IMPRESSION(S) / ED DIAGNOSES  Final diagnoses:  Peritonsillar abscess      Ahmed Prima, MD 05/06/15 [phone]

## 2015-05-13 DIAGNOSIS — J039 Acute tonsillitis, unspecified: Secondary | ICD-10-CM | POA: Diagnosis not present

## 2015-05-23 DIAGNOSIS — Z7901 Long term (current) use of anticoagulants: Secondary | ICD-10-CM | POA: Diagnosis not present

## 2015-05-23 DIAGNOSIS — I63011 Cerebral infarction due to thrombosis of right vertebral artery: Secondary | ICD-10-CM | POA: Diagnosis not present

## 2015-05-23 DIAGNOSIS — D682 Hereditary deficiency of other clotting factors: Secondary | ICD-10-CM | POA: Diagnosis not present

## 2015-06-06 DIAGNOSIS — D6851 Activated protein C resistance: Secondary | ICD-10-CM | POA: Diagnosis not present

## 2015-06-06 DIAGNOSIS — I63011 Cerebral infarction due to thrombosis of right vertebral artery: Secondary | ICD-10-CM | POA: Diagnosis not present

## 2015-06-06 DIAGNOSIS — Z7901 Long term (current) use of anticoagulants: Secondary | ICD-10-CM | POA: Diagnosis not present

## 2015-06-29 DIAGNOSIS — Z7901 Long term (current) use of anticoagulants: Secondary | ICD-10-CM | POA: Diagnosis not present

## 2015-06-29 DIAGNOSIS — I63011 Cerebral infarction due to thrombosis of right vertebral artery: Secondary | ICD-10-CM | POA: Diagnosis not present

## 2015-06-29 DIAGNOSIS — D6851 Activated protein C resistance: Secondary | ICD-10-CM | POA: Diagnosis not present

## 2015-07-04 ENCOUNTER — Ambulatory Visit (INDEPENDENT_AMBULATORY_CARE_PROVIDER_SITE_OTHER): Payer: Medicare Other | Admitting: Neurology

## 2015-07-04 ENCOUNTER — Encounter: Payer: Self-pay | Admitting: Neurology

## 2015-07-04 VITALS — BP 117/66 | HR 74 | Ht 62.0 in | Wt 128.6 lb

## 2015-07-04 DIAGNOSIS — I6521 Occlusion and stenosis of right carotid artery: Secondary | ICD-10-CM

## 2015-07-04 DIAGNOSIS — I63011 Cerebral infarction due to thrombosis of right vertebral artery: Secondary | ICD-10-CM | POA: Diagnosis not present

## 2015-07-04 DIAGNOSIS — D6851 Activated protein C resistance: Secondary | ICD-10-CM | POA: Diagnosis not present

## 2015-07-04 DIAGNOSIS — G3184 Mild cognitive impairment, so stated: Secondary | ICD-10-CM

## 2015-07-04 DIAGNOSIS — I10 Essential (primary) hypertension: Secondary | ICD-10-CM

## 2015-07-04 DIAGNOSIS — Z7901 Long term (current) use of anticoagulants: Secondary | ICD-10-CM | POA: Diagnosis not present

## 2015-07-04 DIAGNOSIS — E785 Hyperlipidemia, unspecified: Secondary | ICD-10-CM

## 2015-07-04 NOTE — Progress Notes (Signed)
STROKE NEUROLOGY FOLLOW UP NOTE  NAME: Heather Alvarado DOB: [birth year]  REASON FOR VISIT: stroke follow up HISTORY FROM: chart and pt and son  Today we had the pleasure of seeing Heather Alvarado in follow-up at our Neurology Clinic. Pt was accompanied by son.   History Summary 80 y.o. female on chronic coumadin for Factor V Leiden deficiency, CAD, HLD, HTN, PVD was admitted to [facility] on 10/22/14 due to balance difficulties for a week. CT showed a right cerebellar infarct. [name] is on coumadin and INR was 2.0 on admission. The patient's son reported that he believed the INR has always been greater than 2.0. Records from Dr. Keane [employer] office indicate an INR 1.7 and 1.9 in the past but it was not clear when this occurred, but on 10/11/14 was 2.7. She denies prior history of stroke. She can not tell what symptoms leading her to check for factor V laiden mutation but she has been on coumadin for a long time. No clot has been known to her.   During admission, she had MRI confirmed acute to subacute right large cerebellar infarct in PICA territory, but MRA unremarkable. It was considered as small vessel disease and she was continued on Coumadin with INR goal 2-3. She was sent to SNF on discharge.  01/04/15 follow up - the patient has been doing well. She remained in SNF for 4-5 week after discharge and then [facility] for a while. Now she is at home doing home exercises by herself. Her INR goal was increased to 2.5-3. She still has some fluctuation of her INR. On 11/08/2014, her INR was 1.1. However the last INR checked was 2.3. Patient stated that her symptoms all resolved, and she is walking without cane or walker at home. She denies any dizziness headache, nausea vomiting. She is not on meclizine or Zofran anymore.  Interval History During the interval time, pt has been doing well. No stroke like symptoms. She has been monitoring INR every month and last check was last week and was told in good condition,  however, she does not remember the level of INR. She stated that her memory is getting worse. She still doing her all ADLs at home. She needs to take care of her husband who is bed-bound. They have [place] during the day to help out. BP 117/66 today in clinic.  REVIEW OF SYSTEMS: Full 14 system review of systems performed and notable only for those listed below and in HPI above, all others are negative:  Constitutional: N/A  Cardiovascular: Swelling in legs for years  Ear/Nose/Throat: N/A  Skin: N/A  Eyes: N/A  Respiratory: N/A  Gastroitestinal: N/A  Genitourinary: N/A Hematology/Lymphatic: N/A  Endocrine: N/A  Musculoskeletal: N/A  Allergy/Immunology: N/A  Neurological: N/A  Psychiatric: N/A Sleep: insomnia  The following represents the patient's updated allergies and side effects list: Allergies  Allergen Reactions  . Ivp Dye [Iodinated Diagnostic Agents] Hives    The neurologically relevant items on the patient's problem list were reviewed on today's visit.  Neurologic Examination  A problem focused neurological exam (12 or more points of the single system neurologic examination, vital signs counts as 1 point, cranial nerves count for 8 points) was performed.  Blood pressure 117/66, pulse 74, height 5\' 2"  (1.575 m), weight 128 lb 9.6 oz (58.333 kg).  General - Well nourished, well developed, in no apparent distress.  Ophthalmologic - fundi not visualized due to small pupils.  Cardiovascular - Regular rate and rhythm with no murmur.  Mental Status -  mini-mental status exam Orientation to time - 5/5 Orientation to place - 5/5 Registration - 3/3 Attention - 3/5 Delayed recall - 0/3 Naming - 2/2 Repetition - 1/1 Comprehension - 3/3 Reading - 1/1 Writing - 1/1 Visuospatial - 1/1 Total 25/30  Cranial Nerves II - XII - II - Visual field intact OU. III, IV, VI - Extraocular movements intact. V - Facial sensation intact bilaterally. VII - Facial movement intact  bilaterally. VIII - Hearing & vestibular intact bilaterally. X - Palate elevates symmetrically. XI - Chin turning & shoulder shrug intact bilaterally. XII - Tongue protrusion intact.  Motor Strength - The patient's strength was normal in all extremities and pronator drift was absent.  Bulk was normal and fasciculations were absent.   Motor Tone - Muscle tone was assessed at the neck and appendages and was normal.  Reflexes - The patient's reflexes were normal in all extremities and she had no pathological reflexes.  Sensory - Light touch, temperature/pinprick were assessed and were normal.    Coordination - The patient had normal movements in the hands and feet with no ataxia or dysmetria.  Tremor was absent.  Gait and Station - The patient's transfers, posture, gait, station, and turns were observed as normal.  Data reviewed: I personally reviewed the images and agree with the radiology interpretations.  Dg Chest 2 View 10/21/2014  No acute abnormality noted.   Ct Head Wo Contrast 10/21/2014  Subacute infarct involving nearly the entire right cerebellar hemisphere and a small portion of the cerebellar vermis. This infarct is not associated with visible acute hemorrhage or significant mass effect.   10/22/2014  MRI HEAD:  Acute to subacute RIGHT posterior inferior cerebellar artery territory infarct. Moderate to severe global parenchymal brain volume loss. Moderate white matter changes suggest chronic small vessel ischemic disease. Solitary LEFT occipital lobe micro hemorrhage, nonspecific.  MRA HEAD: No acute vascular process nor hemodynamically significant stenosis.   CUS - Right: 40-59% internal carotid artery stenosis (high end of the range). Left: 1-39% ICA stenosis. Bilateral: Vertebral artery flow is antegrade.  2D echo - Left ventricle: The cavity size was normal. Wall thickness was normal. Systolic function was normal. The estimated  ejection fraction was in the range of 60% to 65%. Doppler parameters are consistent with abnormal left ventricular relaxation (grade 1 diastolic dysfunction). - Aortic valve: There was trivial regurgitation. - Mitral valve: There was mild regurgitation.  Component     Latest Ref Rng 10/22/2014  Cholesterol     0 - 200 mg/dL 129  Triglycerides     <150 mg/dL 81  HDL     >39 mg/dL 63  Total CHOL/HDL Ratio      2.0  VLDL     0 - 40 mg/dL 16  LDL (calc)     0 - 99 mg/dL 50  Hgb A1c MFr Bld     <5.7 % 6.1 (H)  Mean Plasma Glucose     <117 mg/dL 128 (H)    Assessment: As you may recall, she is a 81 y.o. Caucasian female with PMH of Factor V Leiden deficiency on chronic coumadin, CAD, HLD, HTN, PVD was admitted to [facility] on 10/22/14 due to balance difficulties for a week. MRI showed right cerebellar stroke within PICA territory. Her INR on admission was 2, however it was suspected that her INR was between 1.7-2.0 at time of stroke happened. Her INR goal was increased to 2.5-3.0. She is currently being monitored once a month  and was told in good condition. She can not remember INR value. MMSE today 25/30. She is also on statin for stroke prevention and she had complete resolution of her symptoms.  Plan:  - Continue Coumadin and statin for stroke prevention - INR goal 2.5-3. - Follow up with your primary care physician for stroke risk factor modification. Recommend maintain blood pressure goal <130/80, diabetes with hemoglobin A1c goal below 6.5% and lipids with LDL cholesterol goal below 70 mg/dL.  - Repeat MMSE next visit - RTC in 6 months.  I spent more than 25 minutes of face to face time with the patient. Greater than 50% of time was spent in counseling and coordination of care. We have discussed about mild cognitive impairment, diagnosis, follow up and treatment plan. She agrees with repeat cognitive testing every 6 months.  No orders of the defined types were placed in this  encounter.    No orders of the defined types were placed in this encounter.    Patient Instructions  - Continue Coumadin and statin for stroke prevention - INR goal 2.5-3 - Follow up with your primary care physician for stroke risk factor modification. Recommend maintain blood pressure goal <130/80, diabetes with hemoglobin A1c goal below 6.5% and lipids with LDL cholesterol goal below 70 mg/dL.  - repeat cognitive test next visit - follow up in 6 months.    Rosalin Hawking, MD PhD [facility] [address], [place], [place] [address] [phone]

## 2015-07-04 NOTE — Patient Instructions (Signed)
-   Continue Coumadin and statin for stroke prevention - INR goal 2.5-3 - Follow up with your primary care physician for stroke risk factor modification. Recommend maintain blood pressure goal <130/80, diabetes with hemoglobin A1c goal below 6.5% and lipids with LDL cholesterol goal below 70 mg/dL.  - repeat cognitive test next visit - follow up in 6 months.

## 2015-07-14 DIAGNOSIS — Z6823 Body mass index (BMI) 23.0-23.9, adult: Secondary | ICD-10-CM | POA: Diagnosis not present

## 2015-07-14 DIAGNOSIS — R05 Cough: Secondary | ICD-10-CM | POA: Diagnosis not present

## 2015-07-14 DIAGNOSIS — I10 Essential (primary) hypertension: Secondary | ICD-10-CM | POA: Diagnosis not present

## 2015-07-14 DIAGNOSIS — D6851 Activated protein C resistance: Secondary | ICD-10-CM | POA: Diagnosis not present

## 2015-07-14 DIAGNOSIS — D682 Hereditary deficiency of other clotting factors: Secondary | ICD-10-CM | POA: Diagnosis not present

## 2015-07-14 DIAGNOSIS — Z7901 Long term (current) use of anticoagulants: Secondary | ICD-10-CM | POA: Diagnosis not present

## 2015-07-14 DIAGNOSIS — J069 Acute upper respiratory infection, unspecified: Secondary | ICD-10-CM | POA: Diagnosis not present

## 2015-07-14 DIAGNOSIS — J309 Allergic rhinitis, unspecified: Secondary | ICD-10-CM | POA: Diagnosis not present

## 2015-07-20 DIAGNOSIS — Z6824 Body mass index (BMI) 24.0-24.9, adult: Secondary | ICD-10-CM | POA: Diagnosis not present

## 2015-07-20 DIAGNOSIS — R05 Cough: Secondary | ICD-10-CM | POA: Diagnosis not present

## 2015-07-20 DIAGNOSIS — Z7901 Long term (current) use of anticoagulants: Secondary | ICD-10-CM | POA: Diagnosis not present

## 2015-07-20 DIAGNOSIS — D6851 Activated protein C resistance: Secondary | ICD-10-CM | POA: Diagnosis not present

## 2015-07-20 DIAGNOSIS — D682 Hereditary deficiency of other clotting factors: Secondary | ICD-10-CM | POA: Diagnosis not present

## 2015-07-20 DIAGNOSIS — R0602 Shortness of breath: Secondary | ICD-10-CM | POA: Diagnosis not present

## 2015-07-20 DIAGNOSIS — J069 Acute upper respiratory infection, unspecified: Secondary | ICD-10-CM | POA: Diagnosis not present

## 2015-08-05 DIAGNOSIS — R627 Adult failure to thrive: Secondary | ICD-10-CM | POA: Diagnosis not present

## 2015-08-05 DIAGNOSIS — I10 Essential (primary) hypertension: Secondary | ICD-10-CM | POA: Diagnosis not present

## 2015-08-05 DIAGNOSIS — R269 Unspecified abnormalities of gait and mobility: Secondary | ICD-10-CM | POA: Diagnosis not present

## 2015-08-05 DIAGNOSIS — R2689 Other abnormalities of gait and mobility: Secondary | ICD-10-CM | POA: Diagnosis not present

## 2015-08-05 DIAGNOSIS — Z6823 Body mass index (BMI) 23.0-23.9, adult: Secondary | ICD-10-CM | POA: Diagnosis not present

## 2015-08-05 DIAGNOSIS — D6851 Activated protein C resistance: Secondary | ICD-10-CM | POA: Diagnosis not present

## 2015-08-05 DIAGNOSIS — R2681 Unsteadiness on feet: Secondary | ICD-10-CM | POA: Diagnosis not present

## 2015-08-05 DIAGNOSIS — N183 Chronic kidney disease, stage 3 (moderate): Secondary | ICD-10-CM | POA: Diagnosis not present

## 2015-08-05 DIAGNOSIS — E785 Hyperlipidemia, unspecified: Secondary | ICD-10-CM | POA: Diagnosis not present

## 2015-08-05 DIAGNOSIS — I63011 Cerebral infarction due to thrombosis of right vertebral artery: Secondary | ICD-10-CM | POA: Diagnosis not present

## 2015-08-05 DIAGNOSIS — Z7901 Long term (current) use of anticoagulants: Secondary | ICD-10-CM | POA: Diagnosis not present

## 2015-08-05 DIAGNOSIS — Z23 Encounter for immunization: Secondary | ICD-10-CM | POA: Diagnosis not present

## 2015-09-05 DIAGNOSIS — Z7901 Long term (current) use of anticoagulants: Secondary | ICD-10-CM | POA: Diagnosis not present

## 2015-09-05 DIAGNOSIS — D6851 Activated protein C resistance: Secondary | ICD-10-CM | POA: Diagnosis not present

## 2015-09-05 DIAGNOSIS — M810 Age-related osteoporosis without current pathological fracture: Secondary | ICD-10-CM | POA: Diagnosis not present

## 2015-09-27 DIAGNOSIS — Z7901 Long term (current) use of anticoagulants: Secondary | ICD-10-CM | POA: Diagnosis not present

## 2015-10-05 ENCOUNTER — Ambulatory Visit: Payer: Medicare Other | Admitting: Vascular Surgery

## 2015-10-05 ENCOUNTER — Encounter (HOSPITAL_COMMUNITY): Payer: Medicare Other

## 2015-10-07 ENCOUNTER — Ambulatory Visit (HOSPITAL_COMMUNITY)
Admission: RE | Admit: 2015-10-07 | Discharge: 2015-10-07 | Disposition: A | Discharge status: Home / Self Care | Payer: Medicare Other | Source: Ambulatory Visit | Attending: Internal Medicine | Admitting: Internal Medicine

## 2015-10-07 ENCOUNTER — Encounter (HOSPITAL_COMMUNITY): Payer: Self-pay

## 2015-10-07 ENCOUNTER — Other Ambulatory Visit (HOSPITAL_COMMUNITY): Payer: Self-pay | Admitting: Internal Medicine

## 2015-10-07 DIAGNOSIS — M810 Age-related osteoporosis without current pathological fracture: Secondary | ICD-10-CM | POA: Insufficient documentation

## 2015-10-07 MED ORDER — DENOSUMAB 60 MG/ML ~~LOC~~ SOLN
60.0000 mg | Freq: Once | SUBCUTANEOUS | Status: AC
  Administered 2015-10-07: 60 mg via SUBCUTANEOUS
  Filled 2015-10-07: qty 1

## 2015-10-07 NOTE — Progress Notes (Signed)
Pt received prolia today.  Pt confirms she takes daily vitamin D and calcium.  D/c instructions on prolia given to pt and explained.  Pt d/c ambulatory to lobby with daughter.

## 2015-10-07 NOTE — Discharge Instructions (Signed)
Denosumab injection  What is this medicine?  DENOSUMAB (den oh sue mab) slows bone breakdown. Prolia is used to treat osteoporosis in women after menopause and in men. Xgeva is used to prevent bone fractures and other bone problems caused by cancer bone metastases. Xgeva is also used to treat giant cell tumor of the bone.  This medicine may be used for other purposes; ask your health care provider or pharmacist if you have questions.  What should I tell my health care provider before I take this medicine?  They need to know if you have any of these conditions:  -dental disease  -eczema  -infection or history of infections  -kidney disease or on dialysis  -low blood calcium or vitamin D  -malabsorption syndrome  -scheduled to have surgery or tooth extraction  -taking medicine that contains denosumab  -thyroid or parathyroid disease  -an unusual reaction to denosumab, other medicines, foods, dyes, or preservatives  -pregnant or trying to get pregnant  -breast-feeding  How should I use this medicine?  This medicine is for injection under the skin. It is given by a health care professional in a hospital or clinic setting.  If you are getting Prolia, a special MedGuide will be given to you by the pharmacist with each prescription and refill. Be sure to read this information carefully each time.  For Prolia, talk to your pediatrician regarding the use of this medicine in children. Special care may be needed. For Xgeva, talk to your pediatrician regarding the use of this medicine in children. While this drug may be prescribed for children as young as 13 years for selected conditions, precautions do apply.  Overdosage: If you think you have taken too much of this medicine contact a poison control center or emergency room at once.  NOTE: This medicine is only for you. Do not share this medicine with others.  What if I miss a dose?  It is important not to miss your dose. Call your doctor or health care professional if you are  unable to keep an appointment.  What may interact with this medicine?  Do not take this medicine with any of the following medications:  -other medicines containing denosumab  This medicine may also interact with the following medications:  -medicines that suppress the immune system  -medicines that treat cancer  -steroid medicines like prednisone or cortisone  This list may not describe all possible interactions. Give your health care provider a list of all the medicines, herbs, non-prescription drugs, or dietary supplements you use. Also tell them if you smoke, drink alcohol, or use illegal drugs. Some items may interact with your medicine.  What should I watch for while using this medicine?  Visit your doctor or health care professional for regular checks on your progress. Your doctor or health care professional may order blood tests and other tests to see how you are doing.  Call your doctor or health care professional if you get a cold or other infection while receiving this medicine. Do not treat yourself. This medicine may decrease your body's ability to fight infection.  You should make sure you get enough calcium and vitamin D while you are taking this medicine, unless your doctor tells you not to. Discuss the foods you eat and the vitamins you take with your health care professional.  See your dentist regularly. Brush and floss your teeth as directed. Before you have any dental work done, tell your dentist you are receiving this medicine.  Do   not become pregnant while taking this medicine or for 5 months after stopping it. Women should inform their doctor if they wish to become pregnant or think they might be pregnant. There is a potential for serious side effects to an unborn child. Talk to your health care professional or pharmacist for more information.  What side effects may I notice from receiving this medicine?  Side effects that you should report to your doctor or health care professional as soon as  possible:  -allergic reactions like skin rash, itching or hives, swelling of the face, lips, or tongue  -breathing problems  -chest pain  -fast, irregular heartbeat  -feeling faint or lightheaded, falls  -fever, chills, or any other sign of infection  -muscle spasms, tightening, or twitches  -numbness or tingling  -skin blisters or bumps, or is dry, peels, or red  -slow healing or unexplained pain in the mouth or jaw  -unusual bleeding or bruising  Side effects that usually do not require medical attention (Report these to your doctor or health care professional if they continue or are bothersome.):  -muscle pain  -stomach upset, gas  This list may not describe all possible side effects. Call your doctor for medical advice about side effects. You may report side effects to FDA at 1-800-FDA-1088.  Where should I keep my medicine?  This medicine is only given in a clinic, doctor's office, or other health care setting and will not be stored at home.  NOTE: This sheet is a summary. It may not cover all possible information. If you have questions about this medicine, talk to your doctor, pharmacist, or health care provider.      2016, Elsevier/Gold Standard. (2012-05-26 12:37:47)

## 2015-10-17 ENCOUNTER — Ambulatory Visit (HOSPITAL_COMMUNITY)
Admission: RE | Admit: 2015-10-17 | Discharge: 2015-10-17 | Disposition: A | Discharge status: Home / Self Care | Payer: Medicare Other | Source: Ambulatory Visit | Attending: Vascular Surgery | Admitting: Vascular Surgery

## 2015-10-17 ENCOUNTER — Encounter: Payer: Self-pay | Admitting: Vascular Surgery

## 2015-10-17 DIAGNOSIS — I6521 Occlusion and stenosis of right carotid artery: Secondary | ICD-10-CM

## 2015-10-18 DIAGNOSIS — Z7901 Long term (current) use of anticoagulants: Secondary | ICD-10-CM | POA: Diagnosis not present

## 2015-10-19 ENCOUNTER — Encounter: Payer: Self-pay | Admitting: Family

## 2015-10-19 ENCOUNTER — Ambulatory Visit (INDEPENDENT_AMBULATORY_CARE_PROVIDER_SITE_OTHER): Payer: Medicare Other | Admitting: Family

## 2015-10-19 VITALS — BP 126/68 | HR 77 | Temp 96.9°F | Resp 16 | Ht 63.0 in | Wt 128.0 lb

## 2015-10-19 DIAGNOSIS — I6521 Occlusion and stenosis of right carotid artery: Secondary | ICD-10-CM

## 2015-10-19 NOTE — Patient Instructions (Signed)
Stroke Prevention Some medical conditions and behaviors are associated with an increased chance of having a stroke. You may prevent a stroke by making healthy choices and managing medical conditions. HOW CAN I REDUCE MY RISK OF HAVING A STROKE?   Stay physically active. Get at least 30 minutes of activity on most or all days.  Do not smoke. It may also be helpful to avoid exposure to secondhand smoke.  Limit alcohol use. Moderate alcohol use is considered to be:  No more than 2 drinks per day for men.  No more than 1 drink per day for nonpregnant women.  Eat healthy foods. This involves:  Eating 5 or more servings of fruits and vegetables a day.  Making dietary changes that address high blood pressure (hypertension), high cholesterol, diabetes, or obesity.  Manage your cholesterol levels.  Making food choices that are high in fiber and low in saturated fat, trans fat, and cholesterol may control cholesterol levels.  Take any prescribed medicines to control cholesterol as directed by your health care provider.  Manage your diabetes.  Controlling your carbohydrate and sugar intake is recommended to manage diabetes.  Take any prescribed medicines to control diabetes as directed by your health care provider.  Control your hypertension.  Making food choices that are low in salt (sodium), saturated fat, trans fat, and cholesterol is recommended to manage hypertension.  Ask your health care provider if you need treatment to lower your blood pressure. Take any prescribed medicines to control hypertension as directed by your health care provider.  If you are 18-39 years of age, have your blood pressure checked every 3-5 years. If you are 40 years of age or older, have your blood pressure checked every year.  Maintain a healthy weight.  Reducing calorie intake and making food choices that are low in sodium, saturated fat, trans fat, and cholesterol are recommended to manage  weight.  Stop drug abuse.  Avoid taking birth control pills.  Talk to your health care provider about the risks of taking birth control pills if you are over 35 years old, smoke, get migraines, or have ever had a blood clot.  Get evaluated for sleep disorders (sleep apnea).  Talk to your health care provider about getting a sleep evaluation if you snore a lot or have excessive sleepiness.  Take medicines only as directed by your health care provider.  For some people, aspirin or blood thinners (anticoagulants) are helpful in reducing the risk of forming abnormal blood clots that can lead to stroke. If you have the irregular heart rhythm of atrial fibrillation, you should be on a blood thinner unless there is a good reason you cannot take them.  Understand all your medicine instructions.  Make sure that other conditions (such as anemia or atherosclerosis) are addressed. SEEK IMMEDIATE MEDICAL CARE IF:   You have sudden weakness or numbness of the face, arm, or leg, especially on one side of the body.  Your face or eyelid droops to one side.  You have sudden confusion.  You have trouble speaking (aphasia) or understanding.  You have sudden trouble seeing in one or both eyes.  You have sudden trouble walking.  You have dizziness.  You have a loss of balance or coordination.  You have a sudden, severe headache with no known cause.  You have new chest pain or an irregular heartbeat. Any of these symptoms may represent a serious problem that is an emergency. Do not wait to see if the symptoms will   go away. Get medical help at once. Call your local emergency services (911 in [place] Do not drive yourself to the hospital.   This information is not intended to replace advice given to you by your health care provider. Make sure you discuss any questions you have with your health care provider.   Document Released: 01/03/2005 Document Revised: 12/17/2014 Document Reviewed:  05/29/2013 Elsevier Interactive Patient Education 2016 [employer] Inc.  

## 2015-10-19 NOTE — Progress Notes (Signed)
Established Carotid Patient   History of Present Illness  Heather Alvarado is a 81 y.o. female patient of Dr. Scot Dock who was having some problems with dizziness and was found to have a posterior circulation stroke in November of 2015. Of note carotid duplex scan did not show any evidence of vertebral basilar disease. She does however have a factor V Leiden mutation and is on chronic Coumadin therapy. The patient is right-handed. She denies any subsequent history of stroke, TIAs, expressive or receptive aphasia, or amaurosis fugax.  The records from [facility] indicates that the patient did have a carotid Doppler study done on 03/02/2015 which suggests possibly a greater than 70% right carotid stenosis with no significant stenosis on the left.  The patient has essential hypertension which has been stable. Her hyperlipidemia is under good control on Zocor. She has coronary artery disease but is asymptomatic.  Dr. Scot Dock last saw pt in April of 2016. At that time by duplex the patient had a moderate right carotid stenosis which was asymptomatic. Her vertebral arteries were patent with antegrade flow, she had no evidence of vertebral basilar disease. Thus, Dr. Scot Dock concluded that the moderate right carotid stenosis was asymptomatic. Dr. Scot Dock explained that we typically would not consider carotid endarterectomy in an asymptomatic patient unless the stenosis progressed to greater than 80%. Dr. Scot Dock recommended 6 months follow up, and if no increase in carotid artery stenosis, then yearly surveillance will occur.   She denies claudication symptoms with walking, denies non healing wounds.   The pt returns today for 6 months follow up.  Pt Diabetic: no Pt smoker: non-smoker  Pt meds include: Statin : yes ASA: no Other anticoagulants/antiplatelets: coumadin   Past Medical History  Diagnosis Date  . CAD (coronary artery disease)   . Factor V Leiden ([place])   . GERD  (gastroesophageal reflux disease)   . Multinodular goiter   . Internal hemorrhoids   . Hyperlipidemia   . Hypertension   . Nephrolithiasis   . Osteopenia   . Phlebitis   . PVD (peripheral vascular disease) ([place])   . Varicose vein   . Vitamin D deficiency   . DDD (degenerative disc disease), lumbar   . Blood transfusion without reported diagnosis   . Clotting disorder ([place])   . Stroke [facility]) 10/2014    Social History Social History  Substance Use Topics  . Smoking status: Never Smoker   . Smokeless tobacco: Never Used  . Alcohol Use: No     Comment: occ    Family History Family History  Problem Relation Age of Onset  . Coronary artery disease Mother   . Heart disease Mother   . Alzheimer's disease Sister   . Heart disease Brother   . Heart attack Brother   . Heart attack Brother     Surgical History Past Surgical History  Procedure Laterality Date  . Total abdominal hysterectomy    . Bladder repair    . Coronary artery bypass graft    . Coronary artery bypass graft  2004    lima-lad,rima-rca,lradial-om1&2  . Breast biopsy      Allergies  Allergen Reactions  . Ivp Dye [Iodinated Diagnostic Agents] Hives    Current Outpatient Prescriptions  Medication Sig Dispense Refill  . calcium-vitamin D (OSCAL WITH D) 500-200 MG-UNIT per tablet Take 1 tablet by mouth 2 (two) times daily.     . Cholecalciferol (VITAMIN D) 2000 UNITS CAPS Take 1 capsule by mouth daily.      Marland Kitchen  clindamycin (CLEOCIN) 300 MG capsule Take 1 capsule (300 mg total) by mouth 3 (three) times daily. 30 capsule 0  . cyclobenzaprine (FLEXERIL) 5 MG tablet Take 5 mg by mouth 2 (two) times daily as needed for muscle spasms.    . hydrochlorothiazide (HYDRODIURIL) 25 MG tablet Take 25 mg by mouth daily.      . hydrocortisone (ANUSOL-HC) 25 MG suppository     . Misc Natural Products (OSTEO BI-FLEX ADV DOUBLE ST) CAPS Take 2 capsules by mouth daily.      . Multiple Vitamins-Minerals (MULTIVITAMIN WITH  MINERALS) tablet Take 1 tablet by mouth daily.      . nitroGLYCERIN (NITROSTAT) 0.4 MG SL tablet Place 1 tablet (0.4 mg total) under the tongue every 5 (five) minutes as needed for chest pain. 90 tablet 3  . olmesartan (BENICAR) 20 MG tablet Take 20 mg by mouth daily.      . Omega-3 Fatty Acids (FISH OIL) 1000 MG CAPS Take 1,000 mg by mouth daily.     . ondansetron (ZOFRAN ODT) 4 MG disintegrating tablet 4mg  ODT q4 hours prn nausea/vomit 15 tablet 0  . pantoprazole (PROTONIX) 40 MG tablet Take 40 mg by mouth daily.      . potassium chloride SA (K-DUR,KLOR-CON) 20 MEQ tablet Take 20 mEq by mouth daily.      . simvastatin (ZOCOR) 40 MG tablet Take 40 mg by mouth at bedtime.      . traMADol (ULTRAM) 50 MG tablet Take 1 tablet (50 mg total) by mouth every 6 (six) hours as needed. 10 tablet 0  . warfarin (COUMADIN) 3 MG tablet Take 3 mg by mouth daily. 3 mg every day     No current facility-administered medications for this visit.    Review of Systems : See HPI for pertinent positives and negatives.  Physical Examination  Filed Vitals:   10/19/15 1035 10/19/15 1038  BP: 138/74 126/68  Pulse: 77 77  Temp: 96.9 F (36.1 C)   Resp: 16   Height: 5\' 3"  (1.6 m)   Weight: 128 lb (58.06 kg)   SpO2: 96%    Body mass index is 22.68 kg/(m^2).  General: WDWN female in NAD GAIT: normal Eyes: PERRLA Pulmonary:  Non-labored, CTAB, no rales, no rhonchi, & no wheezing.  Cardiac: regular rhythm, no detected murmur.  VASCULAR EXAM Carotid Bruits Right Left   Negative Negative    Aorta is not palpable. Radial pulses are 2+ palpable and equal.                                                                                                                            LE Pulses Right Left       POPLITEAL  not palpable   not palpable       POSTERIOR TIBIAL  not palpable   not palpable        DORSALIS PEDIS      ANTERIOR TIBIAL  palpable   palpable  Gastrointestinal: soft, nontender, BS  WNL, no r/g,  no palpable masses.  Musculoskeletal: no muscle atrophy/wasting. M/S 5/5 throughout, extremities without ischemic changes.  Neurologic: A&O X 3; Appropriate Affect, Speech is normal CN 2-12 intact, pain and light touch intact in extremities, Motor exam as listed above.   Non-Invasive Vascular Imaging CAROTID DUPLEX 10/17/15   CEREBROVASCULAR DUPLEX EVALUATION    INDICATION: Carotid artery stenosis    PREVIOUS INTERVENTION(S): NA    DUPLEX EXAM:     RIGHT  LEFT  Peak Systolic Velocities (cm/s) End Diastolic Velocities (cm/s) Plaque LOCATION Peak Systolic Velocities (cm/s) End Diastolic Velocities (cm/s) Plaque  94 15  CCA PROXIMAL 91 15   74 14  CCA MID 67 13   102/80 17/13 CP CCA DISTAL 80 14 HT  79 8  ECA 86 6 HT  357 70 CP ICA PROXIMAL 92 18   191 29  ICA MID 75 21   82 19  ICA DISTAL 79 15     4.82 ICA / CCA Ratio (PSV) 1.37  Antegrade Vertebral Flow Antegrade  NA Brachial Systolic Pressure (mmHg) NA  NA Brachial Artery Waveforms NA    Plaque Morphology:  HM = Homogeneous, HT = Heterogeneous, CP = Calcific Plaque, SP = Smooth Plaque, IP = Irregular Plaque     ADDITIONAL FINDINGS: Right subclavian artery PSV100cm/sec; Left subclavian artery PSV154cm/sec    IMPRESSION: Right internal carotid artery stenosis present in the 60%-79% range, stenosis may be underestimated due to calcific plaque present making Doppler interrogation difficult. Left internal carotid artery stenosis present in the less than 40% range.    Compared to the previous exam:  Increase in the disease process since previous study on 04-01-2015.     Assessment: Heather Alvarado is a 81 y.o. female who was found to have a posterior circulation stroke in November of 2015. Of note carotid duplex scan did not show any evidence of vertebral basilar disease. She does however have a factor V Leiden mutation and is on chronic Coumadin therapy. She denies any subsequent history of stroke, TIAs,  expressive or receptive aphasia, or amaurosis fugax. Carotid duplex today suggests  60%-79% right ICA stenosis and minimal left ICA stenosis. Both vertebral arteries are antegrade.  Increase in the disease process since previous study on 04-01-2015.    Plan: Follow-up in 6 months with Carotid Duplex scan.   I discussed in depth with the patient the nature of atherosclerosis, and emphasized the importance of maximal medical management including strict control of blood pressure, blood glucose, and lipid levels, obtaining regular exercise, and continued cessation of smoking.  The patient is aware that without maximal medical management the underlying atherosclerotic disease process will progress, limiting the benefit of any interventions. The patient was given information about stroke prevention and what symptoms should prompt the patient to seek immediate medical care. Thank you for allowing [place] to participate in this patient's care.  Clemon Chambers, RN, MSN, FNP-C [facility] Office: [phone]  Clinic Physician: Scot Dock  10/19/2015 10:10 AM

## 2015-11-17 DIAGNOSIS — D6851 Activated protein C resistance: Secondary | ICD-10-CM | POA: Diagnosis not present

## 2015-11-17 DIAGNOSIS — N183 Chronic kidney disease, stage 3 (moderate): Secondary | ICD-10-CM | POA: Diagnosis not present

## 2015-11-17 DIAGNOSIS — M810 Age-related osteoporosis without current pathological fracture: Secondary | ICD-10-CM | POA: Diagnosis not present

## 2015-11-17 DIAGNOSIS — I6529 Occlusion and stenosis of unspecified carotid artery: Secondary | ICD-10-CM | POA: Diagnosis not present

## 2015-11-17 DIAGNOSIS — I130 Hypertensive heart and chronic kidney disease with heart failure and stage 1 through stage 4 chronic kidney disease, or unspecified chronic kidney disease: Secondary | ICD-10-CM | POA: Diagnosis not present

## 2015-11-17 DIAGNOSIS — Z6823 Body mass index (BMI) 23.0-23.9, adult: Secondary | ICD-10-CM | POA: Diagnosis not present

## 2015-11-17 DIAGNOSIS — Z7901 Long term (current) use of anticoagulants: Secondary | ICD-10-CM | POA: Diagnosis not present

## 2015-11-17 DIAGNOSIS — R2689 Other abnormalities of gait and mobility: Secondary | ICD-10-CM | POA: Diagnosis not present

## 2015-11-17 DIAGNOSIS — F329 Major depressive disorder, single episode, unspecified: Secondary | ICD-10-CM | POA: Diagnosis not present

## 2015-11-17 DIAGNOSIS — I6990 Unspecified sequelae of unspecified cerebrovascular disease: Secondary | ICD-10-CM | POA: Diagnosis not present

## 2015-12-26 ENCOUNTER — Telehealth: Payer: Self-pay | Admitting: *Deleted

## 2015-12-26 DIAGNOSIS — Z7901 Long term (current) use of anticoagulants: Secondary | ICD-10-CM | POA: Diagnosis not present

## 2015-12-26 DIAGNOSIS — D6851 Activated protein C resistance: Secondary | ICD-10-CM | POA: Diagnosis not present

## 2015-12-26 DIAGNOSIS — I63011 Cerebral infarction due to thrombosis of right vertebral artery: Secondary | ICD-10-CM | POA: Diagnosis not present

## 2015-12-26 NOTE — Telephone Encounter (Signed)
I called and LMVM for Simona Huh, son that can see mother sooner this week with CM/NP.

## 2015-12-29 NOTE — Telephone Encounter (Signed)
Made appt 12-30-15 at 0900.

## 2015-12-30 ENCOUNTER — Encounter: Payer: Self-pay | Admitting: Nurse Practitioner

## 2015-12-30 ENCOUNTER — Ambulatory Visit (INDEPENDENT_AMBULATORY_CARE_PROVIDER_SITE_OTHER): Payer: Medicare Other | Admitting: Nurse Practitioner

## 2015-12-30 VITALS — BP 129/63 | HR 79 | Ht 63.0 in | Wt 131.4 lb

## 2015-12-30 DIAGNOSIS — G3184 Mild cognitive impairment, so stated: Secondary | ICD-10-CM

## 2015-12-30 DIAGNOSIS — Z6823 Body mass index (BMI) 23.0-23.9, adult: Secondary | ICD-10-CM | POA: Diagnosis not present

## 2015-12-30 DIAGNOSIS — Z7901 Long term (current) use of anticoagulants: Secondary | ICD-10-CM

## 2015-12-30 DIAGNOSIS — N183 Chronic kidney disease, stage 3 (moderate): Secondary | ICD-10-CM | POA: Diagnosis not present

## 2015-12-30 DIAGNOSIS — D6851 Activated protein C resistance: Secondary | ICD-10-CM | POA: Diagnosis not present

## 2015-12-30 DIAGNOSIS — F329 Major depressive disorder, single episode, unspecified: Secondary | ICD-10-CM | POA: Diagnosis not present

## 2015-12-30 DIAGNOSIS — I63349 Cerebral infarction due to thrombosis of unspecified cerebellar artery: Secondary | ICD-10-CM

## 2015-12-30 DIAGNOSIS — I130 Hypertensive heart and chronic kidney disease with heart failure and stage 1 through stage 4 chronic kidney disease, or unspecified chronic kidney disease: Secondary | ICD-10-CM | POA: Diagnosis not present

## 2015-12-30 DIAGNOSIS — E785 Hyperlipidemia, unspecified: Secondary | ICD-10-CM | POA: Diagnosis not present

## 2015-12-30 DIAGNOSIS — I10 Essential (primary) hypertension: Secondary | ICD-10-CM | POA: Diagnosis not present

## 2015-12-30 NOTE — Progress Notes (Addendum)
[facility]  PATIENT: Heather Alvarado DOB: 1923/12/08   REASON FOR VISIT: Follow-up for stroke due to thrombosis of right vertebral artery, mild cognitive impairment, factor V leiden mutation, hypertension hyperlipidemia HISTORY FROM: Patient and son    HISTORY OF PRESENT ILLNESS: HISTORY [name] y.o. female on chronic coumadin for Factor V Leiden deficiency, CAD, HLD, HTN, PVD was admitted to [facility] on 10/22/14 due to balance difficulties for a week. CT showed a right cerebellar infarct. [name] is on coumadin and INR was 2.0 on admission. The patient's son reported that he believed the INR has always been greater than 2.0. Records from Dr. Keane Police office indicate an INR 1.7 and 1.9 in the past but it was not clear when this occurred, but on 10/11/14 was 2.7. She denies prior history of stroke. She can not tell what symptoms leading her to check for factor V leiden mutation but she has been on coumadin for a long time. No clot has been known to her.   During admission, she had MRI confirmed acute to subacute right large cerebellar infarct in PICA territory, but MRA unremarkable. It was considered as small vessel disease and she was continued on Coumadin with INR goal 2-3. She was sent to SNF on discharge.  01/04/15 follow up [name]- the patient has been doing well. She remained in SNF for 4-5 week after discharge and then [facility] for a while. Now she is at home doing home exercises by herself. Her INR goal was increased to 2.5-3. She still has some fluctuation of her INR. On 11/08/2014, her INR was 1.1. However the last INR checked was 2.3. Patient stated that her symptoms all resolved, and she is walking without cane or walker at home. She denies any dizziness headache, nausea vomiting. She is not on meclizine or Zofran anymore.  Interval History7/25/16 [name] During the interval time, pt has been doing well. No stroke like symptoms. She has been monitoring INR every month and last check  was last week and was told in good condition, however, she does not remember the level of INR. She stated that her memory is getting worse. She still doing her all ADLs at home. She needs to take care of her husband who is bed-bound. They have Tiburones during the day to help out. BP 117/66 today in clinic.  UPDATE 12/30/15 Mr. Heather Alvarado, 82 year old female returns for follow-up with son. She has not had further stroke or TIA symptoms since her original admission to the hospital in November 2015. She remains on Coumadin and it was just checked last week and was okay according to the patient she does not remember the number. She remains on 3 mg daily. Her blood pressure is well controlled today and is 129/63. Her mild cognitive impairment is the same according to her son and the patient. They do not wish to start any medication for this. Her labs are followed by Dr. Virgina Jock. She can perform all of her ADLs, she does some cooking. Hospice is involved with her husband who is bedbound and she has caregivers for him several times a week. She returns for reevaluation. She has not had any falls and she does not use an assistive device  REVIEW OF SYSTEMS: Full 14 system review of systems performed and notable only for those listed, all others are neg:  Constitutional: neg  Cardiovascular: neg Ear/Nose/Throat: neg  Skin: neg Eyes: neg Respiratory: neg Gastroitestinal: neg  Hematology/Lymphatic: neg  Endocrine: neg Musculoskeletal:neg Allergy/Immunology: neg Neurological: neg Psychiatric:  neg Sleep : neg   ALLERGIES: Allergies  Allergen Reactions  . Ivp Dye [Iodinated Diagnostic Agents] Hives    HOME MEDICATIONS: Outpatient Prescriptions Prior to Visit  Medication Sig Dispense Refill  . calcium-vitamin D (OSCAL WITH D) 500-200 MG-UNIT per tablet Take 1 tablet by mouth 2 (two) times daily.     . Cholecalciferol (VITAMIN D) 2000 UNITS CAPS Take 1 capsule by mouth daily.      . clindamycin (CLEOCIN) 300 MG  capsule Take 1 capsule (300 mg total) by mouth 3 (three) times daily. 30 capsule 0  . cyclobenzaprine (FLEXERIL) 5 MG tablet Take 5 mg by mouth 2 (two) times daily as needed for muscle spasms.    . hydrochlorothiazide (HYDRODIURIL) 25 MG tablet Take 25 mg by mouth daily.      . hydrocortisone (ANUSOL-HC) 25 MG suppository     . Misc Natural Products (OSTEO BI-FLEX ADV DOUBLE ST) CAPS Take 2 capsules by mouth daily.      . Multiple Vitamins-Minerals (MULTIVITAMIN WITH MINERALS) tablet Take 1 tablet by mouth daily.      . nitroGLYCERIN (NITROSTAT) 0.4 MG SL tablet Place 1 tablet (0.4 mg total) under the tongue every 5 (five) minutes as needed for chest pain. 90 tablet 3  . olmesartan (BENICAR) 20 MG tablet Take 20 mg by mouth daily.      . Omega-3 Fatty Acids (FISH OIL) 1000 MG CAPS Take 1,000 mg by mouth daily.     . ondansetron (ZOFRAN ODT) 4 MG disintegrating tablet 4mg  ODT q4 hours prn nausea/vomit 15 tablet 0  . pantoprazole (PROTONIX) 40 MG tablet Take 40 mg by mouth daily.      . potassium chloride SA (K-DUR,KLOR-CON) 20 MEQ tablet Take 20 mEq by mouth daily.      . simvastatin (ZOCOR) 40 MG tablet Take 40 mg by mouth at bedtime.      . traMADol (ULTRAM) 50 MG tablet Take 1 tablet (50 mg total) by mouth every 6 (six) hours as needed. 10 tablet 0  . warfarin (COUMADIN) 3 MG tablet Take 3 mg by mouth daily. 3 mg every day     No facility-administered medications prior to visit.    PAST MEDICAL HISTORY: Past Medical History  Diagnosis Date  . CAD (coronary artery disease)   . Factor V Leiden ([place])   . GERD (gastroesophageal reflux disease)   . Multinodular goiter   . Internal hemorrhoids   . Hyperlipidemia   . Hypertension   . Nephrolithiasis   . Osteopenia   . Phlebitis   . PVD (peripheral vascular disease) ([place])   . Varicose vein   . Vitamin D deficiency   . DDD (degenerative disc disease), lumbar   . Blood transfusion without reported diagnosis   . Clotting disorder ([place])     . Stroke [facility]) 10/2014    PAST SURGICAL HISTORY: Past Surgical History  Procedure Laterality Date  . Total abdominal hysterectomy    . Bladder repair    . Coronary artery bypass graft    . Coronary artery bypass graft  2004    lima-lad,rima-rca,lradial-om1&2  . Breast biopsy      FAMILY HISTORY: Family History  Problem Relation Age of Onset  . Coronary artery disease Mother   . Heart disease Mother   . Alzheimer's disease Sister   . Heart disease Brother   . Heart attack Brother   . Heart attack Brother     SOCIAL HISTORY: Social History   Social History  .  Marital Status: Married    Spouse Name: N/A  . Number of Children: 2  . Years of Education: N/A   Occupational History  . Retired    Social History Main Topics  . Smoking status: Never Smoker   . Smokeless tobacco: Never Used  . Alcohol Use: No     Comment: occ  . Drug Use: No  . Sexual Activity: Not on file   Other Topics Concern  . Not on file   Social History Narrative   Lives at home with husband who has caregivers.       PHYSICAL EXAM  Filed Vitals:   12/30/15 0855  BP: 129/63  Pulse: 79  Height: 5\' 3"  (1.6 m)  Weight: 131 lb 6.4 oz (59.603 kg)   Body mass index is 23.28 kg/(m^2). General - Well nourished, well developed, in no apparent distress. Neck: Supple, no carotid bruits  Cardiovascular - Regular rate and rhythm with no murmur.  Neurological examination  Mental alert and appropriate, speech and language fluent MMSE 25 out of 30. She missed 3 of 3 recall and the year. She also missed items in calculation Cranial Nerves II - XII -fundi not visualized due to small pupils. II - Visual field intact OU. III, IV, VI - Extraocular movements intact. V - Facial sensation intact bilaterally. VII - Facial movement intact bilaterally. VIII - Hearing & vestibular intact bilaterally. X - Palate elevates symmetrically. XI - Chin turning & shoulder shrug intact bilaterally. XII - Tongue  protrusion intact.  Motor Strength - The patient's strength was normal in all extremities and pronator drift was absent. Bulk and tone is normal  Reflexes - The patient's reflexes were normal in all extremities and she had no pathological reflexes. Sensory - Light touch, temperature/pinprick were assessed and were normal.  Coordination - The patient had normal movements in the hands and feet with no ataxia or dysmetria. Tremor was absent. Gait and Station - The patient's transfers, posture, gait, station, and turns were observed as normal.   DIAGNOSTIC DATA (LABS, IMAGING, TESTING) - I reviewed patient records, labs, notes, testing and imaging myself where available.  Lab Results  Component Value Date   WBC 8.0 02/21/2015   HGB 13.4 02/21/2015   HCT 40.1 02/21/2015   MCV 89.5 02/21/2015   PLT 207 02/21/2015      Component Value Date/Time   NA 139 02/21/2015 1431   K 4.4 02/21/2015 1431   CL 106 02/21/2015 1431   CO2 23 02/21/2015 1431   GLUCOSE 134* 02/21/2015 1431   BUN 25* 02/21/2015 1431   CREATININE 1.25* 02/21/2015 1431   CALCIUM 10.2 02/21/2015 1431   GFRNONAA 37* 02/21/2015 1431   GFRAA 43* 02/21/2015 1431    ASSESSMENT AND PLAN 82 y.o. Caucasian female with PMH of Factor V Leiden deficiency on chronic coumadin, CAD, HLD, HTN, PVD was admitted to [facility] on 10/22/14 due to balance difficulties for a week. MRI showed right cerebellar stroke within PICA territory. Her INR on admission was 2, however it was suspected that her INR was between 1.7-2.0 at time of stroke happened. Her INR goal was increased to 2.5-3.0. She is currently being monitored once a month and was told in good condition. She can not remember INR value. MMSE today 25/30. She is also on statin for stroke prevention and she had complete resolution of her symptoms. The patient is a current patient of Dr. Erlinda Hong  who is out of the office today . This note is sent  to the work in doctor.     Plan:  Continue Coumadin  and statin for stroke prevention INR goal 2.5-3. Follow up with your primary care physician for stroke risk factor modification. Recommend maintain blood pressure goal <130/80, 129/63 is todays reading Lipids with LDL cholesterol goal below 70 mg/dL.  Repeat MMSE stable patient nor her son are interested in any medications RTC in 6 months if remains stable will discharge Dennie Bible, [facility], [facility], APRN  [facility] [address] [place], [place] [address] [phone]  Discussed patient with Dennie Bible NP and agree with assessment and plan Richard A. Felecia Shelling, MD, PhD Certified in Neurology, [place] [facility], Sleep Medicine, Pain Medicine and Neuroimaging  [facility] [address] [place], [place] [address] [phone]

## 2015-12-30 NOTE — Patient Instructions (Addendum)
Continue Coumadin and statin for stroke prevention INR goal 2.5-3. Follow up with your primary care physician for stroke risk factor modification. Recommend maintain blood pressure goal <130/80, 129/63 is todays reading Lipids with LDL cholesterol goal below 70 mg/dL.  Repeat MMSE stable  RTC in 6 months

## 2016-01-02 ENCOUNTER — Ambulatory Visit: Payer: Medicare Other | Admitting: Neurology

## 2016-01-25 DIAGNOSIS — D682 Hereditary deficiency of other clotting factors: Secondary | ICD-10-CM | POA: Diagnosis not present

## 2016-01-25 DIAGNOSIS — Z7901 Long term (current) use of anticoagulants: Secondary | ICD-10-CM | POA: Diagnosis not present

## 2016-02-03 DIAGNOSIS — R05 Cough: Secondary | ICD-10-CM | POA: Diagnosis not present

## 2016-02-03 DIAGNOSIS — M8588 Other specified disorders of bone density and structure, other site: Secondary | ICD-10-CM | POA: Diagnosis not present

## 2016-02-03 DIAGNOSIS — I10 Essential (primary) hypertension: Secondary | ICD-10-CM | POA: Diagnosis not present

## 2016-02-03 DIAGNOSIS — N390 Urinary tract infection, site not specified: Secondary | ICD-10-CM | POA: Diagnosis not present

## 2016-02-03 DIAGNOSIS — Z6824 Body mass index (BMI) 24.0-24.9, adult: Secondary | ICD-10-CM | POA: Diagnosis not present

## 2016-02-03 DIAGNOSIS — R8290 Unspecified abnormal findings in urine: Secondary | ICD-10-CM | POA: Diagnosis not present

## 2016-02-03 DIAGNOSIS — M545 Low back pain: Secondary | ICD-10-CM | POA: Diagnosis not present

## 2016-02-03 DIAGNOSIS — J069 Acute upper respiratory infection, unspecified: Secondary | ICD-10-CM | POA: Diagnosis not present

## 2016-02-03 DIAGNOSIS — M519 Unspecified thoracic, thoracolumbar and lumbosacral intervertebral disc disorder: Secondary | ICD-10-CM | POA: Diagnosis not present

## 2016-02-14 DIAGNOSIS — D682 Hereditary deficiency of other clotting factors: Secondary | ICD-10-CM | POA: Diagnosis not present

## 2016-02-14 DIAGNOSIS — Z7901 Long term (current) use of anticoagulants: Secondary | ICD-10-CM | POA: Diagnosis not present

## 2016-03-13 DIAGNOSIS — E784 Other hyperlipidemia: Secondary | ICD-10-CM | POA: Diagnosis not present

## 2016-03-13 DIAGNOSIS — M810 Age-related osteoporosis without current pathological fracture: Secondary | ICD-10-CM | POA: Diagnosis not present

## 2016-03-13 DIAGNOSIS — R739 Hyperglycemia, unspecified: Secondary | ICD-10-CM | POA: Diagnosis not present

## 2016-03-13 DIAGNOSIS — I10 Essential (primary) hypertension: Secondary | ICD-10-CM | POA: Diagnosis not present

## 2016-03-13 DIAGNOSIS — R946 Abnormal results of thyroid function studies: Secondary | ICD-10-CM | POA: Diagnosis not present

## 2016-03-13 DIAGNOSIS — Z7901 Long term (current) use of anticoagulants: Secondary | ICD-10-CM | POA: Diagnosis not present

## 2016-03-29 DIAGNOSIS — Z6824 Body mass index (BMI) 24.0-24.9, adult: Secondary | ICD-10-CM | POA: Diagnosis not present

## 2016-03-29 DIAGNOSIS — Z0000 Encounter for general adult medical examination without abnormal findings: Secondary | ICD-10-CM | POA: Diagnosis not present

## 2016-03-29 DIAGNOSIS — F329 Major depressive disorder, single episode, unspecified: Secondary | ICD-10-CM | POA: Diagnosis not present

## 2016-03-29 DIAGNOSIS — G4709 Other insomnia: Secondary | ICD-10-CM | POA: Diagnosis not present

## 2016-03-29 DIAGNOSIS — G3184 Mild cognitive impairment, so stated: Secondary | ICD-10-CM | POA: Diagnosis not present

## 2016-03-29 DIAGNOSIS — D6851 Activated protein C resistance: Secondary | ICD-10-CM | POA: Diagnosis not present

## 2016-03-29 DIAGNOSIS — R946 Abnormal results of thyroid function studies: Secondary | ICD-10-CM | POA: Diagnosis not present

## 2016-03-29 DIAGNOSIS — R627 Adult failure to thrive: Secondary | ICD-10-CM | POA: Diagnosis not present

## 2016-03-29 DIAGNOSIS — I130 Hypertensive heart and chronic kidney disease with heart failure and stage 1 through stage 4 chronic kidney disease, or unspecified chronic kidney disease: Secondary | ICD-10-CM | POA: Diagnosis not present

## 2016-03-29 DIAGNOSIS — I6529 Occlusion and stenosis of unspecified carotid artery: Secondary | ICD-10-CM | POA: Diagnosis not present

## 2016-03-29 DIAGNOSIS — Z23 Encounter for immunization: Secondary | ICD-10-CM | POA: Diagnosis not present

## 2016-04-18 DIAGNOSIS — D6851 Activated protein C resistance: Secondary | ICD-10-CM | POA: Diagnosis not present

## 2016-04-18 DIAGNOSIS — Z7901 Long term (current) use of anticoagulants: Secondary | ICD-10-CM | POA: Diagnosis not present

## 2016-05-09 DIAGNOSIS — D6851 Activated protein C resistance: Secondary | ICD-10-CM | POA: Diagnosis not present

## 2016-05-09 DIAGNOSIS — Z7901 Long term (current) use of anticoagulants: Secondary | ICD-10-CM | POA: Diagnosis not present

## 2016-05-17 DIAGNOSIS — Z7901 Long term (current) use of anticoagulants: Secondary | ICD-10-CM | POA: Diagnosis not present

## 2016-05-17 DIAGNOSIS — D6851 Activated protein C resistance: Secondary | ICD-10-CM | POA: Diagnosis not present

## 2016-06-05 DIAGNOSIS — D6851 Activated protein C resistance: Secondary | ICD-10-CM | POA: Diagnosis not present

## 2016-06-05 DIAGNOSIS — Z7901 Long term (current) use of anticoagulants: Secondary | ICD-10-CM | POA: Diagnosis not present

## 2016-06-07 DIAGNOSIS — F329 Major depressive disorder, single episode, unspecified: Secondary | ICD-10-CM | POA: Diagnosis not present

## 2016-06-07 DIAGNOSIS — R627 Adult failure to thrive: Secondary | ICD-10-CM | POA: Diagnosis not present

## 2016-06-07 DIAGNOSIS — Z6823 Body mass index (BMI) 23.0-23.9, adult: Secondary | ICD-10-CM | POA: Diagnosis not present

## 2016-06-07 DIAGNOSIS — Z1389 Encounter for screening for other disorder: Secondary | ICD-10-CM | POA: Diagnosis not present

## 2016-06-07 DIAGNOSIS — I130 Hypertensive heart and chronic kidney disease with heart failure and stage 1 through stage 4 chronic kidney disease, or unspecified chronic kidney disease: Secondary | ICD-10-CM | POA: Diagnosis not present

## 2016-06-07 DIAGNOSIS — G3184 Mild cognitive impairment, so stated: Secondary | ICD-10-CM | POA: Diagnosis not present

## 2016-06-27 ENCOUNTER — Ambulatory Visit: Payer: Medicare Other | Admitting: Nurse Practitioner

## 2016-06-27 ENCOUNTER — Telehealth: Payer: Self-pay | Admitting: Nurse Practitioner

## 2016-06-27 DIAGNOSIS — M47814 Spondylosis without myelopathy or radiculopathy, thoracic region: Secondary | ICD-10-CM | POA: Diagnosis not present

## 2016-06-27 DIAGNOSIS — M47816 Spondylosis without myelopathy or radiculopathy, lumbar region: Secondary | ICD-10-CM | POA: Diagnosis not present

## 2016-06-27 NOTE — Telephone Encounter (Signed)
Pt's son, Simona Huh, called in to cancel appt for his mother at 15am and she had an appt at 10am. He said she is unable to get out of bed due to back spasms and he will call to r/s her appt at a later date.

## 2016-06-27 NOTE — Telephone Encounter (Signed)
noted 

## 2016-06-28 ENCOUNTER — Encounter: Payer: Self-pay | Admitting: Nurse Practitioner

## 2016-06-28 DIAGNOSIS — M546 Pain in thoracic spine: Secondary | ICD-10-CM | POA: Diagnosis not present

## 2016-06-28 DIAGNOSIS — M545 Low back pain: Secondary | ICD-10-CM | POA: Diagnosis not present

## 2016-06-28 DIAGNOSIS — M62830 Muscle spasm of back: Secondary | ICD-10-CM | POA: Diagnosis not present

## 2016-06-28 DIAGNOSIS — M5136 Other intervertebral disc degeneration, lumbar region: Secondary | ICD-10-CM | POA: Diagnosis not present

## 2016-07-03 DIAGNOSIS — Z7901 Long term (current) use of anticoagulants: Secondary | ICD-10-CM | POA: Diagnosis not present

## 2016-07-03 DIAGNOSIS — D649 Anemia, unspecified: Secondary | ICD-10-CM | POA: Diagnosis not present

## 2016-07-03 DIAGNOSIS — R6889 Other general symptoms and signs: Secondary | ICD-10-CM | POA: Diagnosis not present

## 2016-07-03 DIAGNOSIS — I2510 Atherosclerotic heart disease of native coronary artery without angina pectoris: Secondary | ICD-10-CM | POA: Diagnosis not present

## 2016-07-03 DIAGNOSIS — D6851 Activated protein C resistance: Secondary | ICD-10-CM | POA: Diagnosis not present

## 2016-07-10 DIAGNOSIS — M545 Low back pain: Secondary | ICD-10-CM | POA: Diagnosis not present

## 2016-07-13 DIAGNOSIS — M545 Low back pain: Secondary | ICD-10-CM | POA: Diagnosis not present

## 2016-07-16 DIAGNOSIS — M545 Low back pain: Secondary | ICD-10-CM | POA: Diagnosis not present

## 2016-07-20 DIAGNOSIS — M545 Low back pain: Secondary | ICD-10-CM | POA: Diagnosis not present

## 2016-07-25 DIAGNOSIS — M545 Low back pain: Secondary | ICD-10-CM | POA: Diagnosis not present

## 2016-07-27 DIAGNOSIS — M545 Low back pain: Secondary | ICD-10-CM | POA: Diagnosis not present

## 2016-07-30 DIAGNOSIS — I130 Hypertensive heart and chronic kidney disease with heart failure and stage 1 through stage 4 chronic kidney disease, or unspecified chronic kidney disease: Secondary | ICD-10-CM | POA: Diagnosis not present

## 2016-07-30 DIAGNOSIS — M546 Pain in thoracic spine: Secondary | ICD-10-CM | POA: Diagnosis not present

## 2016-07-30 DIAGNOSIS — I10 Essential (primary) hypertension: Secondary | ICD-10-CM | POA: Diagnosis not present

## 2016-07-30 DIAGNOSIS — M545 Low back pain: Secondary | ICD-10-CM | POA: Diagnosis not present

## 2016-07-30 DIAGNOSIS — N183 Chronic kidney disease, stage 3 (moderate): Secondary | ICD-10-CM | POA: Diagnosis not present

## 2016-07-30 DIAGNOSIS — M5136 Other intervertebral disc degeneration, lumbar region: Secondary | ICD-10-CM | POA: Diagnosis not present

## 2016-07-30 DIAGNOSIS — M5134 Other intervertebral disc degeneration, thoracic region: Secondary | ICD-10-CM | POA: Diagnosis not present

## 2016-08-03 DIAGNOSIS — M545 Low back pain: Secondary | ICD-10-CM | POA: Diagnosis not present

## 2016-08-06 DIAGNOSIS — D6851 Activated protein C resistance: Secondary | ICD-10-CM | POA: Diagnosis not present

## 2016-08-06 DIAGNOSIS — M545 Low back pain: Secondary | ICD-10-CM | POA: Diagnosis not present

## 2016-08-06 DIAGNOSIS — Z7901 Long term (current) use of anticoagulants: Secondary | ICD-10-CM | POA: Diagnosis not present

## 2016-08-06 DIAGNOSIS — I130 Hypertensive heart and chronic kidney disease with heart failure and stage 1 through stage 4 chronic kidney disease, or unspecified chronic kidney disease: Secondary | ICD-10-CM | POA: Diagnosis not present

## 2016-08-10 DIAGNOSIS — M545 Low back pain: Secondary | ICD-10-CM | POA: Diagnosis not present

## 2016-08-30 DIAGNOSIS — M5136 Other intervertebral disc degeneration, lumbar region: Secondary | ICD-10-CM | POA: Diagnosis not present

## 2016-08-30 DIAGNOSIS — M545 Low back pain: Secondary | ICD-10-CM | POA: Diagnosis not present

## 2016-08-30 DIAGNOSIS — M546 Pain in thoracic spine: Secondary | ICD-10-CM | POA: Diagnosis not present

## 2016-08-30 DIAGNOSIS — M5134 Other intervertebral disc degeneration, thoracic region: Secondary | ICD-10-CM | POA: Diagnosis not present

## 2016-09-03 DIAGNOSIS — Z7901 Long term (current) use of anticoagulants: Secondary | ICD-10-CM | POA: Diagnosis not present

## 2016-09-03 DIAGNOSIS — Z6823 Body mass index (BMI) 23.0-23.9, adult: Secondary | ICD-10-CM | POA: Diagnosis not present

## 2016-09-03 DIAGNOSIS — I10 Essential (primary) hypertension: Secondary | ICD-10-CM | POA: Diagnosis not present

## 2016-09-03 DIAGNOSIS — Z23 Encounter for immunization: Secondary | ICD-10-CM | POA: Diagnosis not present

## 2016-09-03 DIAGNOSIS — R600 Localized edema: Secondary | ICD-10-CM | POA: Diagnosis not present

## 2016-09-03 DIAGNOSIS — D6851 Activated protein C resistance: Secondary | ICD-10-CM | POA: Diagnosis not present

## 2016-09-07 DIAGNOSIS — M545 Low back pain: Secondary | ICD-10-CM | POA: Diagnosis not present

## 2016-09-11 DIAGNOSIS — M545 Low back pain: Secondary | ICD-10-CM | POA: Diagnosis not present

## 2016-09-14 DIAGNOSIS — M545 Low back pain: Secondary | ICD-10-CM | POA: Diagnosis not present

## 2016-09-17 IMAGING — CT CT HEAD W/O CM
1 series · 16 of 30 positions shown, 20 images · non-contrast
Comparison: None.

CLINICAL DATA: Dizziness, nausea and vomiting.

EXAM:
CT HEAD WITHOUT CONTRAST
TECHNIQUE: Contiguous axial images were obtained from the base of the skull
through the vertex without intravenous contrast.

[Series 2: head 5.0 h30s · axial · 0.45mm/px · z∈[-45,+100]mm · 16 of 33 slices shown, 20 images]
[im 2/33  brain]
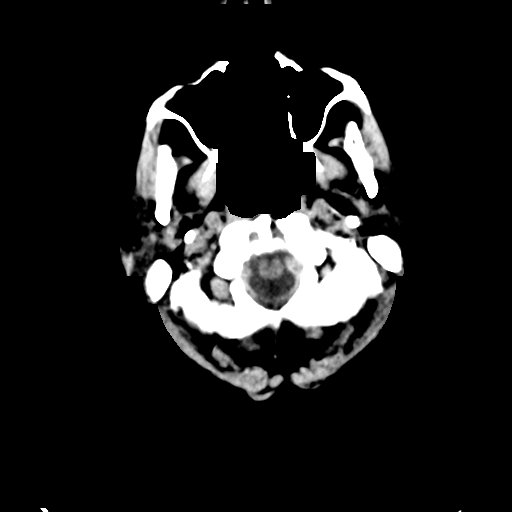
[im 2/33  bone]
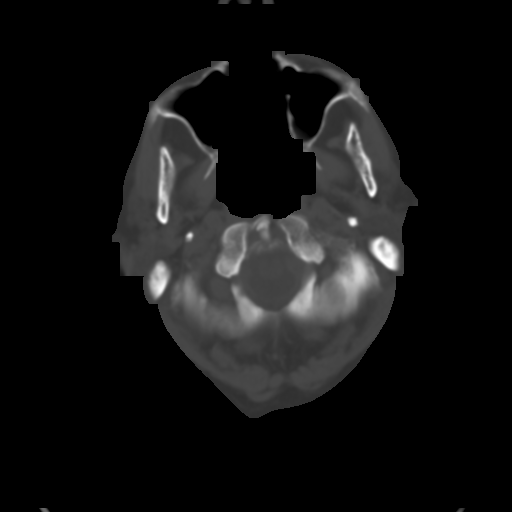
[im 4/33  brain]
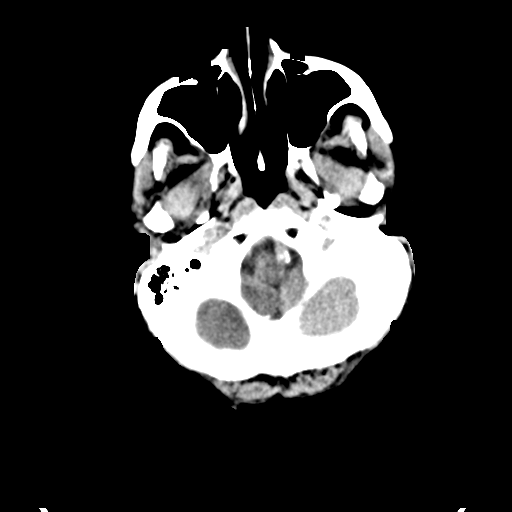
[im 6/33  brain]
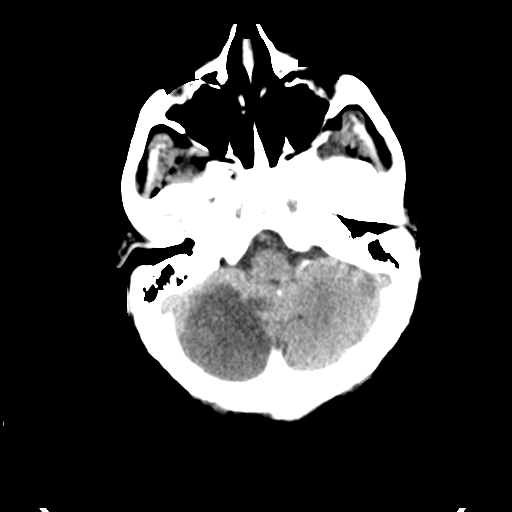
[im 8/33  brain]
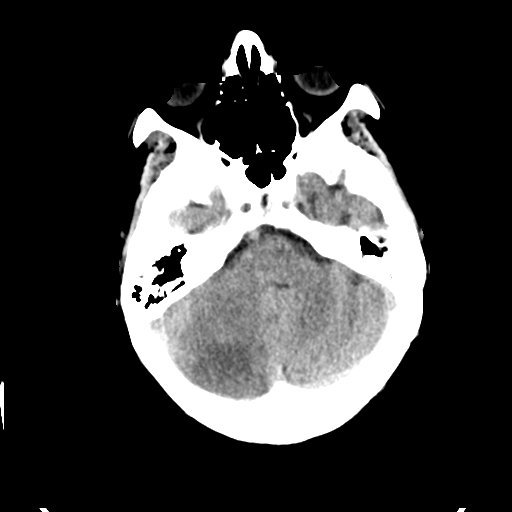
[im 9/33  brain]
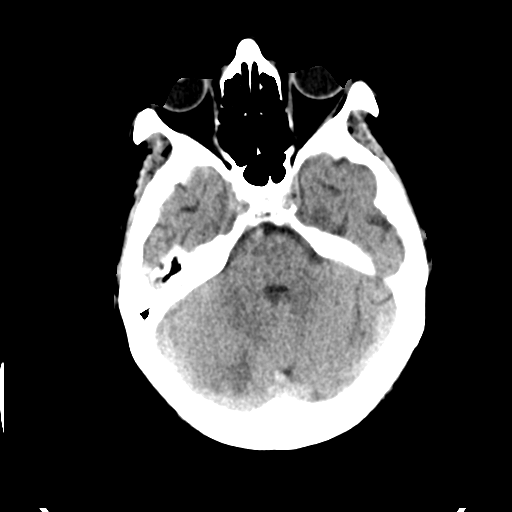
[im 9/33  bone]
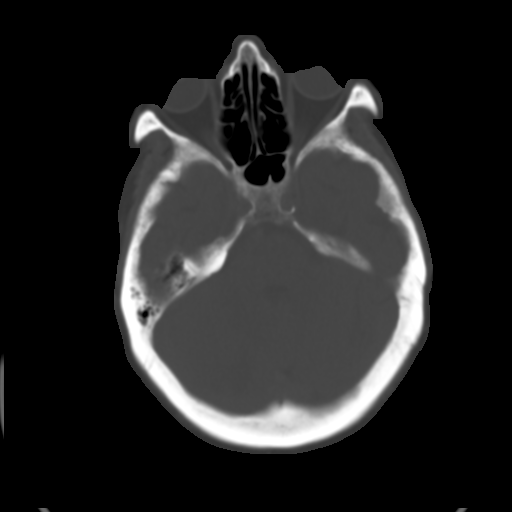
[im 12/33  brain]
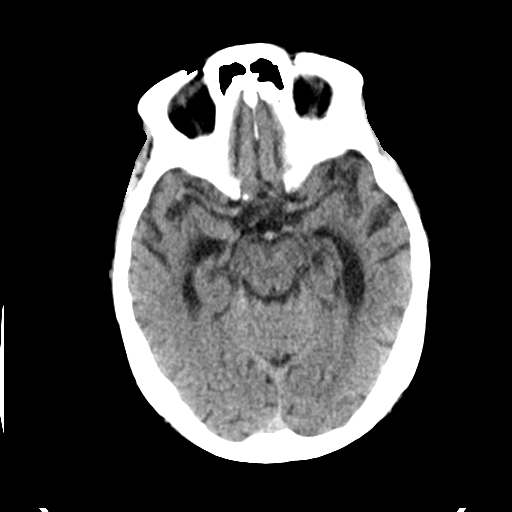
[im 14/33  brain]
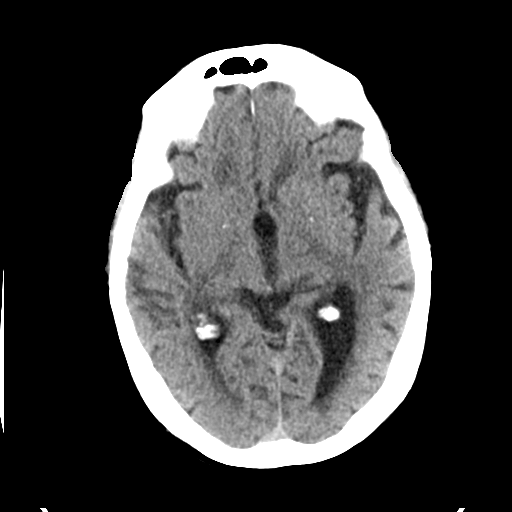
[im 16/33  brain]
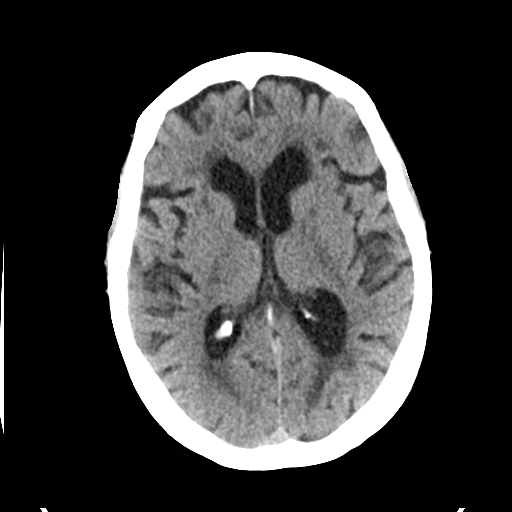
[im 17/33  brain]
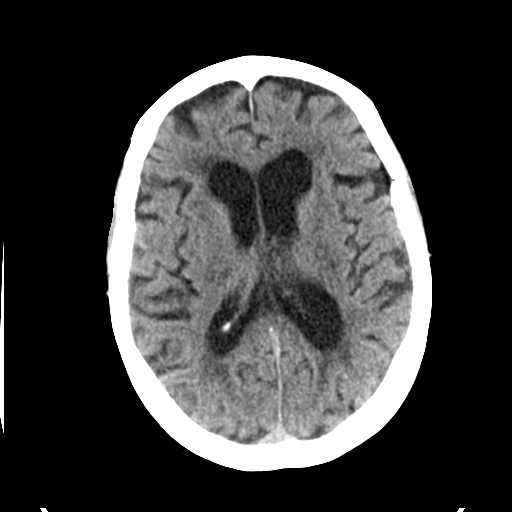
[im 17/33  bone]
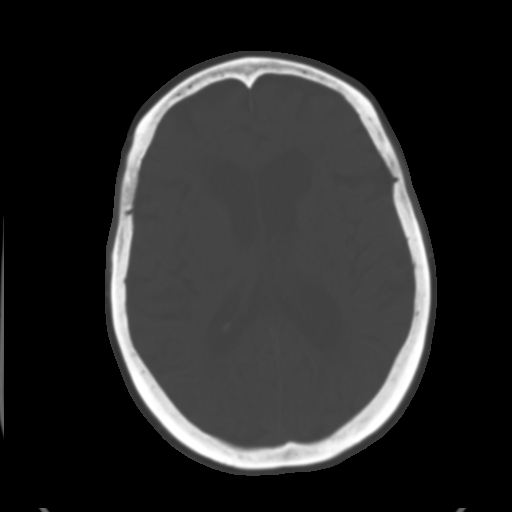
[im 19/33  brain]
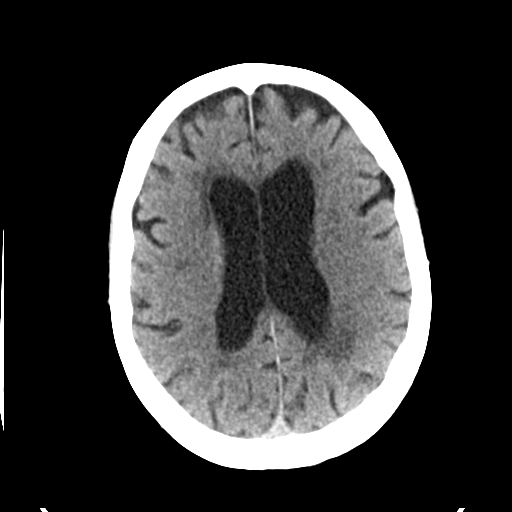
[im 21/33  brain]
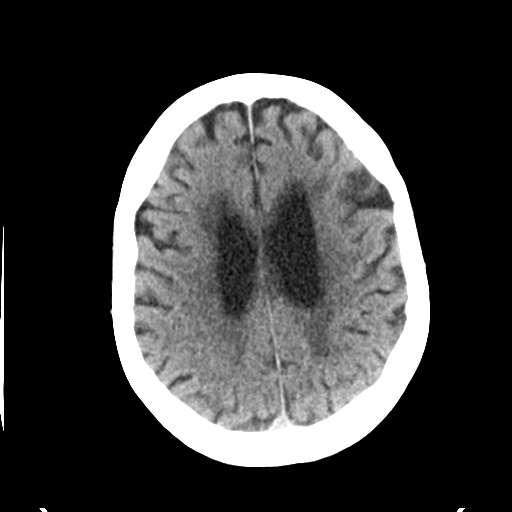
[im 24/33  brain]
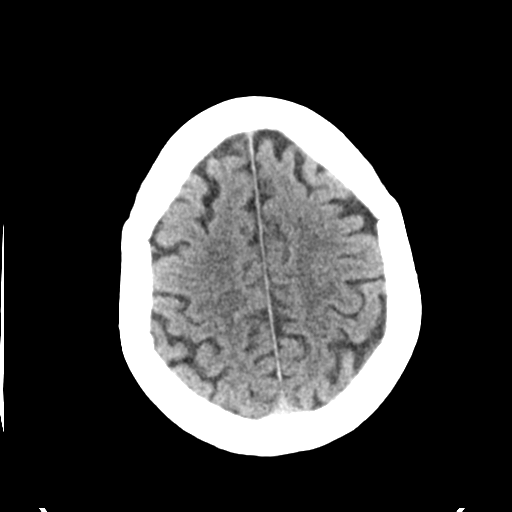
[im 25/33  brain]
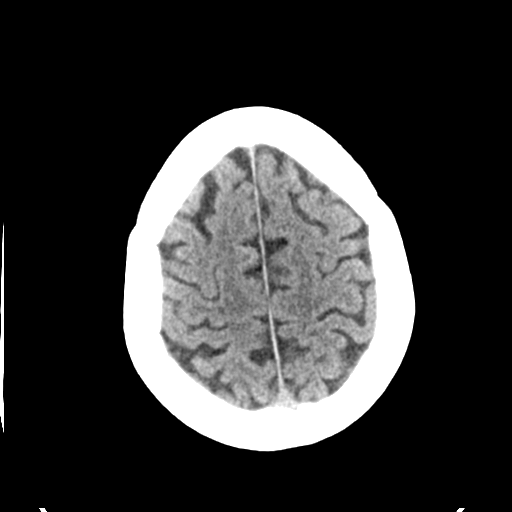
[im 25/33  bone]
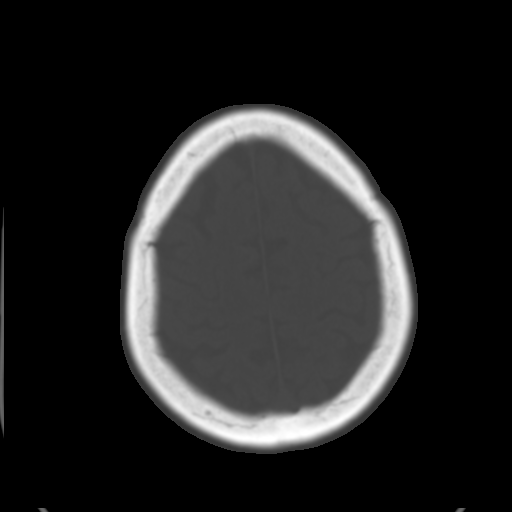
[im 27/33  brain]
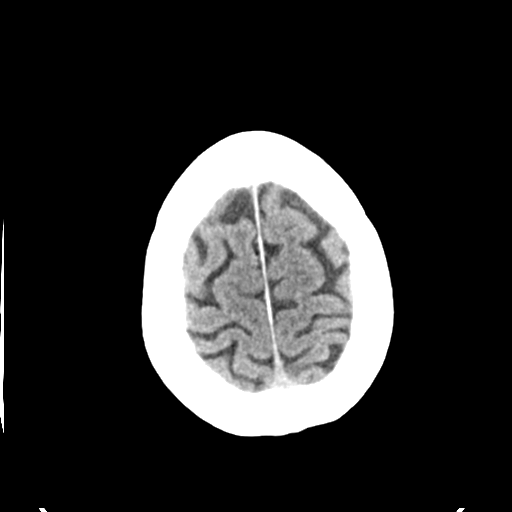
[im 29/33  brain]
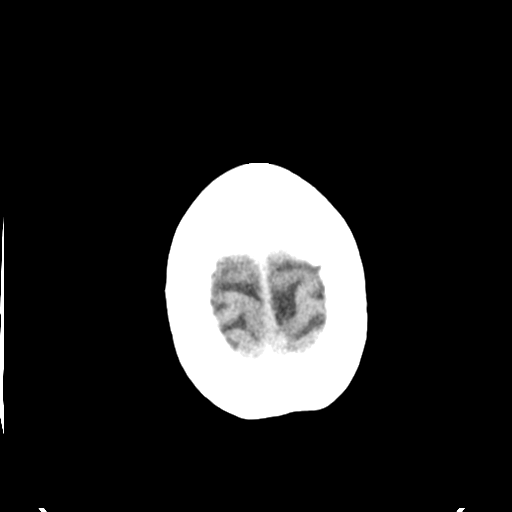
[im 31/33  brain]
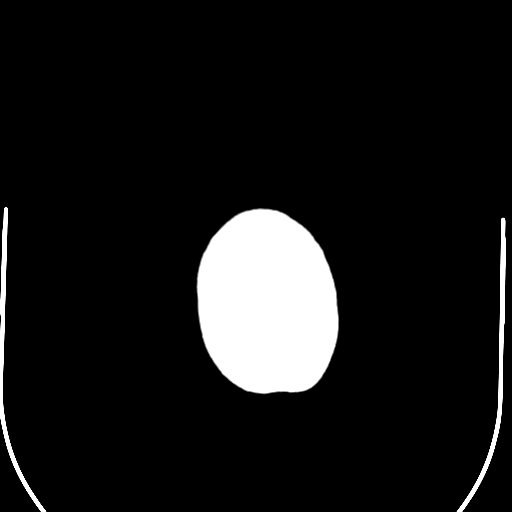

[16 of 30 positions shown; findings below may reference images not displayed]

FINDINGS: Large infarct noted of the right cerebellar hemisphere which
involves a small portion of the vermis. Based on density, this is
not very acute and likely greater than 24 hr old. No associated
overt hemorrhage or significant mass effect. The rest the brain
shows advanced small vessel ischemic changes and cortical atrophy.
No hydrocephalus identified. The skull is unremarkable.
IMPRESSION: Subacute infarct involving nearly the entire right cerebellar
hemisphere and a small portion of the cerebellar vermis. This
infarct is not associated with visible acute hemorrhage or
significant mass effect.

Critical Value/emergent results were called by telephone at the time
of interpretation on 10/21/2014 at [DATE] to Dr. Felner Ceola ,
who verbally acknowledged these results.

## 2016-09-24 DIAGNOSIS — M545 Low back pain: Secondary | ICD-10-CM | POA: Diagnosis not present

## 2016-09-26 DIAGNOSIS — M545 Low back pain: Secondary | ICD-10-CM | POA: Diagnosis not present

## 2016-09-28 DIAGNOSIS — G3184 Mild cognitive impairment, so stated: Secondary | ICD-10-CM | POA: Diagnosis not present

## 2016-09-28 DIAGNOSIS — Z6823 Body mass index (BMI) 23.0-23.9, adult: Secondary | ICD-10-CM | POA: Diagnosis not present

## 2016-09-28 DIAGNOSIS — R7309 Other abnormal glucose: Secondary | ICD-10-CM | POA: Diagnosis not present

## 2016-09-28 DIAGNOSIS — F329 Major depressive disorder, single episode, unspecified: Secondary | ICD-10-CM | POA: Diagnosis not present

## 2016-09-28 DIAGNOSIS — I130 Hypertensive heart and chronic kidney disease with heart failure and stage 1 through stage 4 chronic kidney disease, or unspecified chronic kidney disease: Secondary | ICD-10-CM | POA: Diagnosis not present

## 2016-09-28 DIAGNOSIS — M545 Low back pain: Secondary | ICD-10-CM | POA: Diagnosis not present

## 2016-09-28 DIAGNOSIS — R600 Localized edema: Secondary | ICD-10-CM | POA: Diagnosis not present

## 2016-09-28 DIAGNOSIS — D6851 Activated protein C resistance: Secondary | ICD-10-CM | POA: Diagnosis not present

## 2016-09-28 DIAGNOSIS — M1990 Unspecified osteoarthritis, unspecified site: Secondary | ICD-10-CM | POA: Diagnosis not present

## 2016-09-28 DIAGNOSIS — Z7901 Long term (current) use of anticoagulants: Secondary | ICD-10-CM | POA: Diagnosis not present

## 2016-10-02 DIAGNOSIS — M545 Low back pain: Secondary | ICD-10-CM | POA: Diagnosis not present

## 2016-10-05 DIAGNOSIS — M545 Low back pain: Secondary | ICD-10-CM | POA: Diagnosis not present

## 2016-10-09 DIAGNOSIS — M545 Low back pain: Secondary | ICD-10-CM | POA: Diagnosis not present

## 2016-10-11 DIAGNOSIS — M546 Pain in thoracic spine: Secondary | ICD-10-CM | POA: Diagnosis not present

## 2016-10-11 DIAGNOSIS — M545 Low back pain: Secondary | ICD-10-CM | POA: Diagnosis not present

## 2016-10-11 DIAGNOSIS — M62830 Muscle spasm of back: Secondary | ICD-10-CM | POA: Diagnosis not present

## 2016-10-11 DIAGNOSIS — M791 Myalgia: Secondary | ICD-10-CM | POA: Diagnosis not present

## 2016-10-17 DIAGNOSIS — M545 Low back pain: Secondary | ICD-10-CM | POA: Diagnosis not present

## 2016-10-19 DIAGNOSIS — M545 Low back pain: Secondary | ICD-10-CM | POA: Diagnosis not present

## 2016-10-22 DIAGNOSIS — Z7901 Long term (current) use of anticoagulants: Secondary | ICD-10-CM | POA: Diagnosis not present

## 2016-10-22 DIAGNOSIS — D6851 Activated protein C resistance: Secondary | ICD-10-CM | POA: Diagnosis not present

## 2016-10-22 DIAGNOSIS — L989 Disorder of the skin and subcutaneous tissue, unspecified: Secondary | ICD-10-CM | POA: Diagnosis not present

## 2016-10-26 DIAGNOSIS — M545 Low back pain: Secondary | ICD-10-CM | POA: Diagnosis not present

## 2016-10-31 DIAGNOSIS — M545 Low back pain: Secondary | ICD-10-CM | POA: Diagnosis not present

## 2016-11-05 DIAGNOSIS — M5134 Other intervertebral disc degeneration, thoracic region: Secondary | ICD-10-CM | POA: Diagnosis not present

## 2016-11-19 DIAGNOSIS — Z7901 Long term (current) use of anticoagulants: Secondary | ICD-10-CM | POA: Diagnosis not present

## 2016-11-19 DIAGNOSIS — D6851 Activated protein C resistance: Secondary | ICD-10-CM | POA: Diagnosis not present

## 2016-11-23 DIAGNOSIS — L821 Other seborrheic keratosis: Secondary | ICD-10-CM | POA: Diagnosis not present

## 2016-11-23 DIAGNOSIS — D1801 Hemangioma of skin and subcutaneous tissue: Secondary | ICD-10-CM | POA: Diagnosis not present

## 2016-11-27 DIAGNOSIS — M62830 Muscle spasm of back: Secondary | ICD-10-CM | POA: Diagnosis not present

## 2016-11-27 DIAGNOSIS — M5134 Other intervertebral disc degeneration, thoracic region: Secondary | ICD-10-CM | POA: Diagnosis not present

## 2016-12-21 DIAGNOSIS — Z7901 Long term (current) use of anticoagulants: Secondary | ICD-10-CM | POA: Diagnosis not present

## 2016-12-21 DIAGNOSIS — D6851 Activated protein C resistance: Secondary | ICD-10-CM | POA: Diagnosis not present

## 2017-01-09 ENCOUNTER — Other Ambulatory Visit: Payer: Self-pay | Admitting: Internal Medicine

## 2017-01-09 DIAGNOSIS — E041 Nontoxic single thyroid nodule: Secondary | ICD-10-CM

## 2017-01-14 DIAGNOSIS — D6851 Activated protein C resistance: Secondary | ICD-10-CM | POA: Diagnosis not present

## 2017-01-14 DIAGNOSIS — Z7901 Long term (current) use of anticoagulants: Secondary | ICD-10-CM | POA: Diagnosis not present

## 2017-01-17 ENCOUNTER — Ambulatory Visit
Admission: RE | Admit: 2017-01-17 | Discharge: 2017-01-17 | Disposition: A | Discharge status: Home / Self Care | Payer: Medicare Other | Source: Ambulatory Visit | Attending: Internal Medicine | Admitting: Internal Medicine

## 2017-01-17 DIAGNOSIS — E042 Nontoxic multinodular goiter: Secondary | ICD-10-CM | POA: Diagnosis not present

## 2017-01-17 DIAGNOSIS — E041 Nontoxic single thyroid nodule: Secondary | ICD-10-CM

## 2017-01-18 IMAGING — MR MR HEAD W/O CM
9 of 12 series · 32 of 48 positions shown · non-contrast
Comparison: MR brain 10/21/2014.  CT head 02/21/2015.

CLINICAL DATA: Patient awoke with dizziness and vomiting. History
of prior cerebral infarction. Initial encounter.

EXAM:
MRI HEAD WITHOUT CONTRAST
TECHNIQUE: Multiplanar, multiecho pulse sequences of the brain and surrounding
structures were obtained without intravenous contrast.

[Series 2: FLAIR · sagittal · 5.0mm · 0.47mm/px · 1 of 23 slices shown (1 of 2)]
[im 1/23]
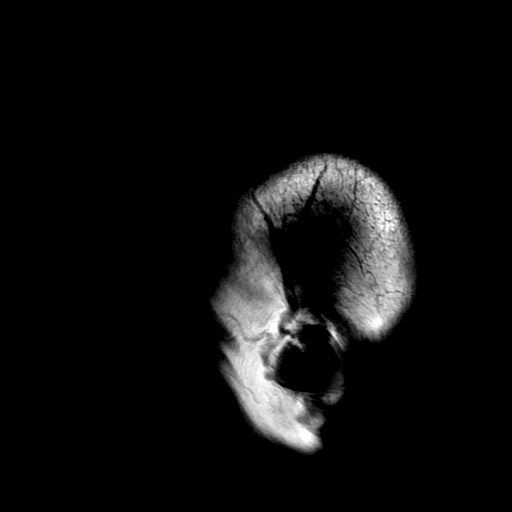

[Series 4: DWI · axial · 3.0mm · 0.94mm/px · z∈[-86,+54]mm · 8 of 96 slices shown (1 of 4)]
[im 1/96]
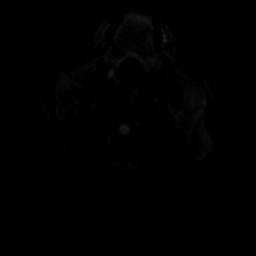
[im 14/96]
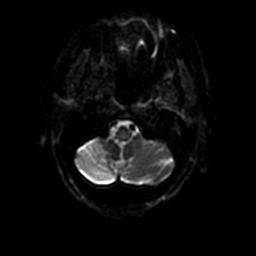
[im 28/96]
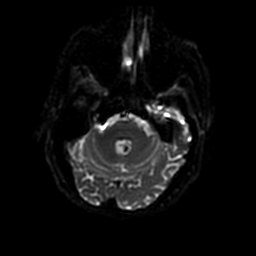
[im 41/96]
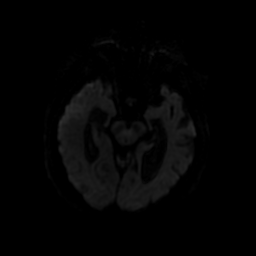
[im 55/96]
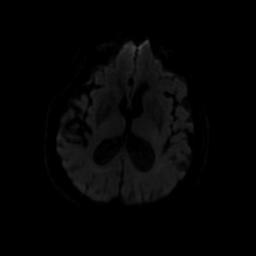
[im 68/96]
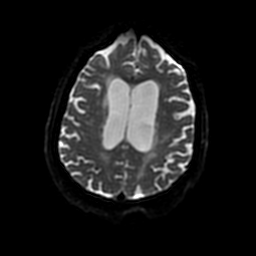
[im 82/96]
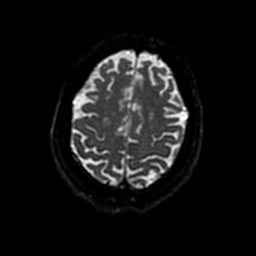
[im 96/96]
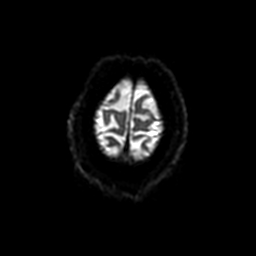

[Series 5: T2 · axial · 5.0mm · 0.47mm/px · z∈[-84,+60]mm · 2 of 25 slices shown (1 of 2)]
[im 1/25]
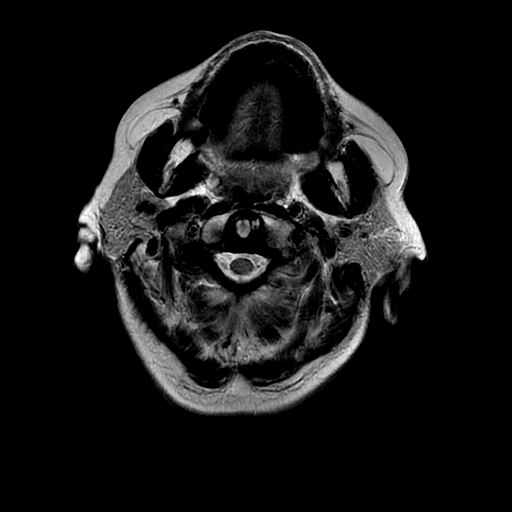
[im 25/25]
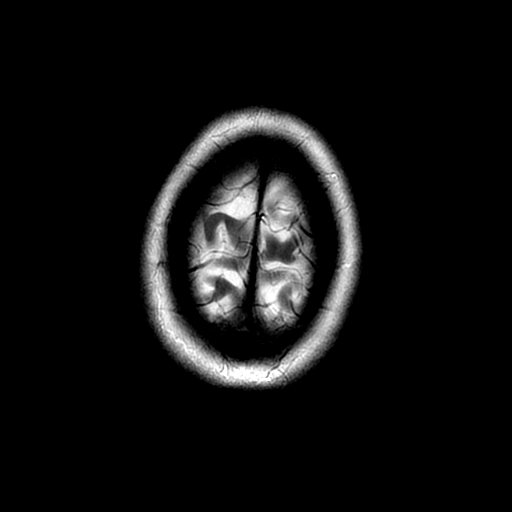

[Series 6: FLAIR · axial · 5.0mm · 0.47mm/px · z∈[-84,+60]mm · 2 of 25 slices shown (2 of 2)]
[im 1/25]
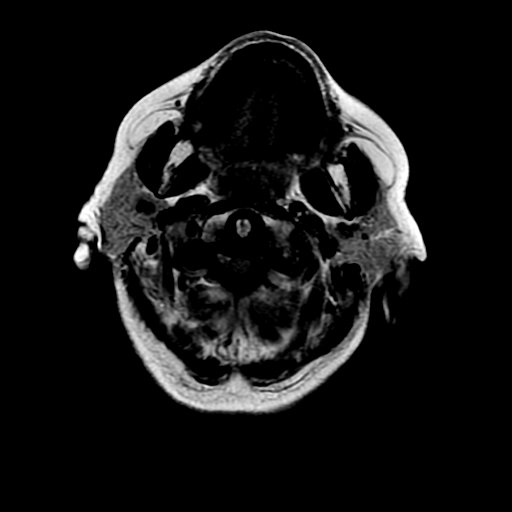
[im 25/25]
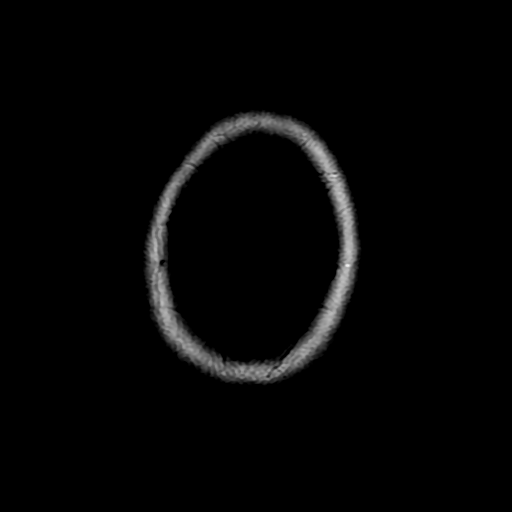

[Series 7: DWI · coronal · 5.0mm · 0.94mm/px · 5 of 64 slices shown (2 of 4)]
[im 1/64]
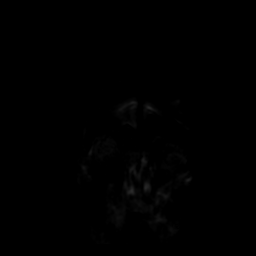
[im 16/64]
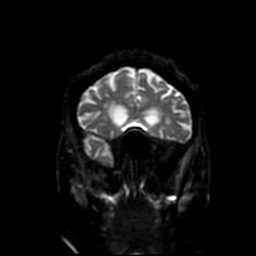
[im 32/64]
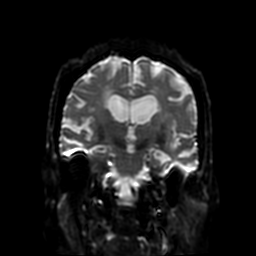
[im 48/64]
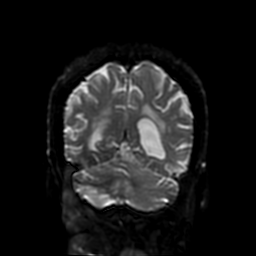
[im 64/64]
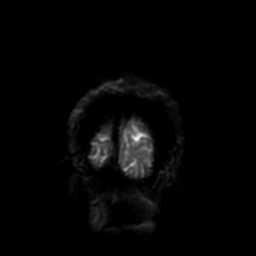

[Series 8: (person_name) · axial · 3.0mm · 0.47mm/px · z∈[-85,+19]mm · 6 of 100 slices shown]
[im 1/100]
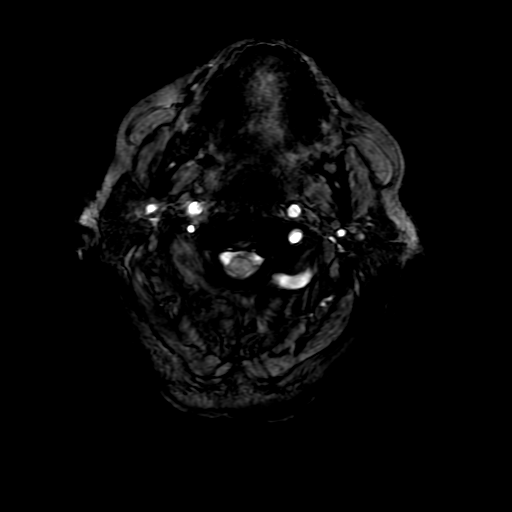
[im 15/100]
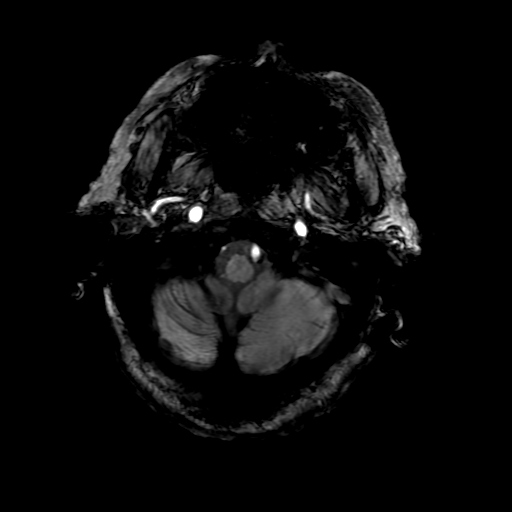
[im 29/100]
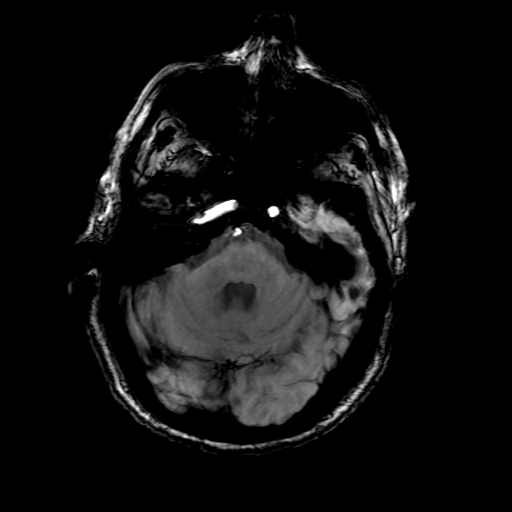
[im 43/100]
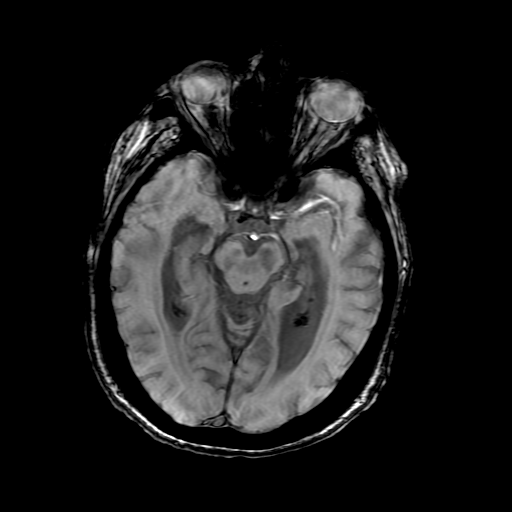
[im 57/100]
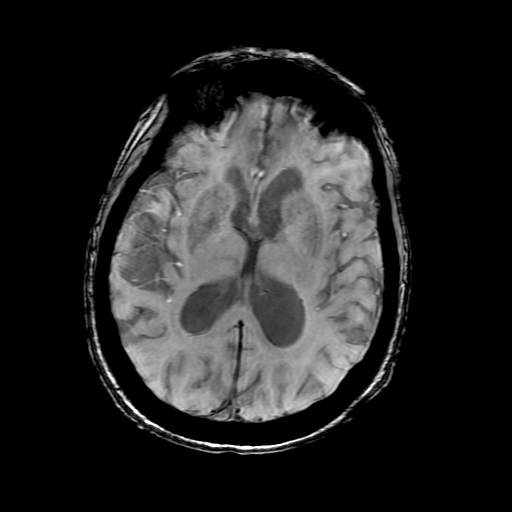
[im 71/100]
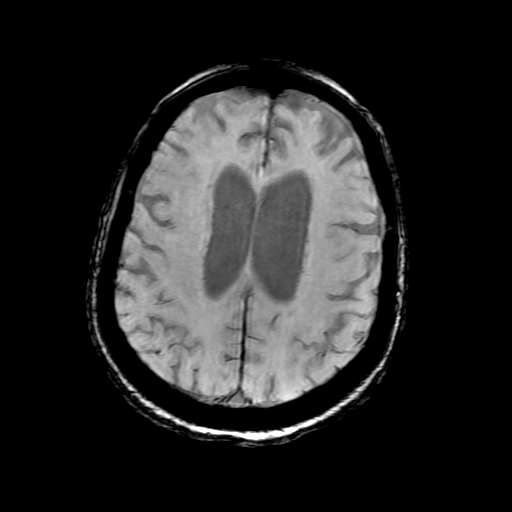

[Series 10: T2 · coronal · 5.0mm · 0.39mm/px · 2 of 27 slices shown (2 of 2)]
[im 1/27]
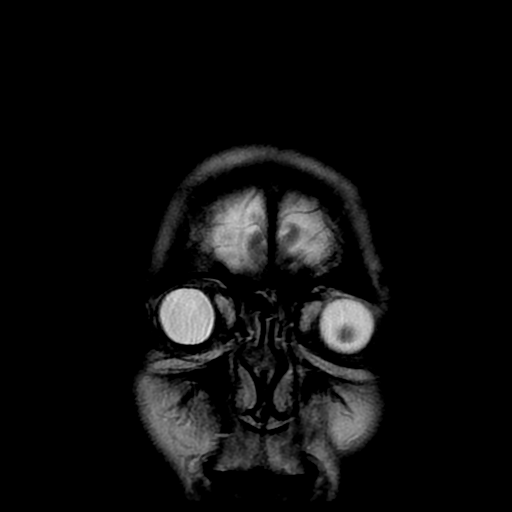
[im 27/27]
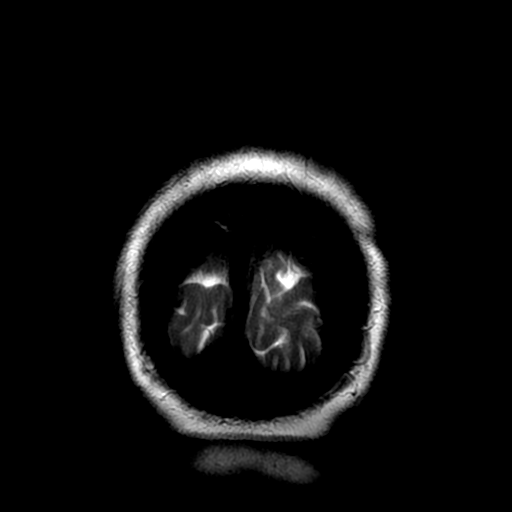

[Series 400: DWI · axial · 3.0mm · 0.94mm/px · z∈[-86,+54]mm · 4 of 47 slices shown (3 of 4)]
[im 1/47]
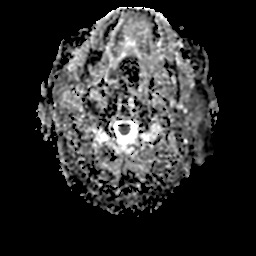
[im 16/47]
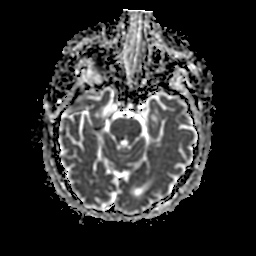
[im 31/47]
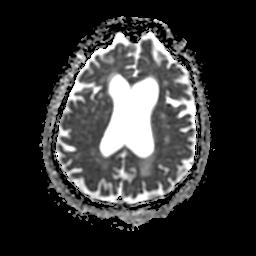
[im 47/47]
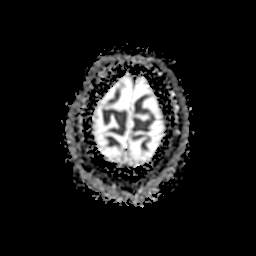

[Series 700: DWI · coronal · 5.0mm · 0.94mm/px · 2 of 31 slices shown (4 of 4)]
[im 1/31]
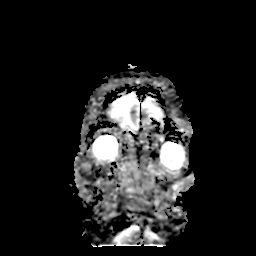
[im 31/31]
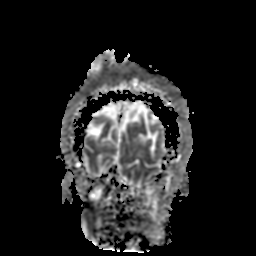

[32 of 48 positions shown; findings below may reference images not displayed]

FINDINGS: No evidence for acute infarction, hemorrhage, mass lesion,
hydrocephalus, or extra-axial fluid. Generalized cerebral and
cerebellar atrophy. Extensive small vessel disease throughout the
periventricular and subcortical white matter, with minor changes in
the brainstem.

Large area of remote cerebral infarction affects the RIGHT inferior
cerebellum predominantly RIGHT PICA territory. Flow voids are
maintained in the carotid, basilar, and LEFT greater than RIGHT
vertebral arteries. Specifically the RIGHT PICA is patent.

No foci of chronic hemorrhage. Extracranial soft tissues
unremarkable. No sinus air-fluid levels.

Compared with prior CT there is good general agreement. Compared
with prior MR, the infarct has resolved.
IMPRESSION: Chronic RIGHT cerebellar infarct. No acute intracranial findings. No
new areas of cerebellar, brainstem, or cerebral infarction.

Advanced atrophy and small vessel disease.

## 2017-01-24 ENCOUNTER — Encounter (HOSPITAL_COMMUNITY): Payer: Self-pay | Admitting: Emergency Medicine

## 2017-01-24 ENCOUNTER — Emergency Department (HOSPITAL_COMMUNITY): Payer: Medicare Other

## 2017-01-24 ENCOUNTER — Emergency Department (HOSPITAL_COMMUNITY)
Admission: EM | Admit: 2017-01-24 | Discharge: 2017-01-24 | Disposition: A | Discharge status: Home / Self Care | Payer: Medicare Other | Attending: Emergency Medicine | Admitting: Emergency Medicine

## 2017-01-24 DIAGNOSIS — S098XXA Other specified injuries of head, initial encounter: Secondary | ICD-10-CM | POA: Diagnosis not present

## 2017-01-24 DIAGNOSIS — I2510 Atherosclerotic heart disease of native coronary artery without angina pectoris: Secondary | ICD-10-CM | POA: Diagnosis not present

## 2017-01-24 DIAGNOSIS — W16212A Fall in (into) filled bathtub causing other injury, initial encounter: Secondary | ICD-10-CM | POA: Diagnosis not present

## 2017-01-24 DIAGNOSIS — Y92012 Bathroom of single-family (private) house as the place of occurrence of the external cause: Secondary | ICD-10-CM | POA: Insufficient documentation

## 2017-01-24 DIAGNOSIS — Y999 Unspecified external cause status: Secondary | ICD-10-CM | POA: Insufficient documentation

## 2017-01-24 DIAGNOSIS — S00432A Contusion of left ear, initial encounter: Secondary | ICD-10-CM | POA: Diagnosis not present

## 2017-01-24 DIAGNOSIS — I10 Essential (primary) hypertension: Secondary | ICD-10-CM | POA: Insufficient documentation

## 2017-01-24 DIAGNOSIS — R51 Headache: Secondary | ICD-10-CM | POA: Insufficient documentation

## 2017-01-24 DIAGNOSIS — S0991XA Unspecified injury of ear, initial encounter: Secondary | ICD-10-CM | POA: Diagnosis present

## 2017-01-24 DIAGNOSIS — Y939 Activity, unspecified: Secondary | ICD-10-CM | POA: Diagnosis not present

## 2017-01-24 DIAGNOSIS — E041 Nontoxic single thyroid nodule: Secondary | ICD-10-CM

## 2017-01-24 DIAGNOSIS — Z7901 Long term (current) use of anticoagulants: Secondary | ICD-10-CM | POA: Diagnosis not present

## 2017-01-24 DIAGNOSIS — Z8673 Personal history of transient ischemic attack (TIA), and cerebral infarction without residual deficits: Secondary | ICD-10-CM | POA: Diagnosis not present

## 2017-01-24 DIAGNOSIS — T148XXA Other injury of unspecified body region, initial encounter: Secondary | ICD-10-CM

## 2017-01-24 DIAGNOSIS — Z79899 Other long term (current) drug therapy: Secondary | ICD-10-CM | POA: Diagnosis not present

## 2017-01-24 DIAGNOSIS — M542 Cervicalgia: Secondary | ICD-10-CM | POA: Diagnosis not present

## 2017-01-24 DIAGNOSIS — S199XXA Unspecified injury of neck, initial encounter: Secondary | ICD-10-CM | POA: Diagnosis not present

## 2017-01-24 DIAGNOSIS — S0990XA Unspecified injury of head, initial encounter: Secondary | ICD-10-CM | POA: Diagnosis not present

## 2017-01-24 DIAGNOSIS — Z951 Presence of aortocoronary bypass graft: Secondary | ICD-10-CM | POA: Diagnosis not present

## 2017-01-24 DIAGNOSIS — W19XXXA Unspecified fall, initial encounter: Secondary | ICD-10-CM

## 2017-01-24 LAB — I-STAT CHEM 8, ED
BUN: 23 mg/dL — ABNORMAL HIGH (ref 6–20)
CALCIUM ION: 1.2 mmol/L (ref 1.15–1.40)
Chloride: 104 mmol/L (ref 101–111)
Creatinine, Ser: 1.4 mg/dL — ABNORMAL HIGH (ref 0.44–1.00)
Glucose, Bld: 90 mg/dL (ref 65–99)
HEMATOCRIT: 32 % — AB (ref 36.0–46.0)
Hemoglobin: 10.9 g/dL — ABNORMAL LOW (ref 12.0–15.0)
Potassium: 4.4 mmol/L (ref 3.5–5.1)
Sodium: 140 mmol/L (ref 135–145)
TCO2: 23 mmol/L (ref 0–100)

## 2017-01-24 LAB — PROTIME-INR
INR: 2.93
Prothrombin Time: 31.2 seconds — ABNORMAL HIGH (ref 11.4–15.2)

## 2017-01-24 NOTE — ED Triage Notes (Signed)
Pt to ER by [facility] from home for evaluation of left ear pain after a fall she experienced yesterday while showering, she slipped and fell, no LOC. Pt is on coumadin. Hematoma present to left ear. VSS. Alert and oriented.

## 2017-01-24 NOTE — ED Provider Notes (Signed)
[facility] DEPT Provider Note   CSN: [id number] Arrival date & time: 01/24/17  1301     History   Chief Complaint Chief Complaint  Patient presents with  . Fall    HPI Heather Alvarado is a 83 y.o. female who presents to the Emergency department for fall. There is a level V caveat due to dementia. She has a past medical history of dementia and is on Coumadin. She is brought in by her son, who gives the history. Apparently the patient had a fall in her bathtub yesterday, hitting the left side of her head and ear. She complains of pain in the ear but denies injuries elsewhere. Her son states that this morning when he came to see her, she seemed more lethargic than normal. He asked that she took her pain medicine which she sometimes takes for back pain, but she denied taking that.  HPI  Past Medical History:  Diagnosis Date  . Blood transfusion without reported diagnosis   . CAD (coronary artery disease)   . Clotting disorder ([place])   . DDD (degenerative disc disease), lumbar   . Factor V Leiden ([place])   . GERD (gastroesophageal reflux disease)   . Hyperlipidemia   . Hypertension   . Internal hemorrhoids   . Multinodular goiter   . Nephrolithiasis   . Osteopenia   . Phlebitis   . PVD (peripheral vascular disease) ([place])   . Stroke ([place]) 10/2014  . Varicose vein   . Vitamin D deficiency     Patient Active Problem List   Diagnosis Date Noted  . MCI (mild cognitive impairment) 07/04/2015  . 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 Essential hypertension 01/04/2015  . HLD (hyperlipidemia) 01/04/2015  . Factor V Leiden mutation ([place]) 01/04/2015  . Chronic anticoagulation 01/04/2015  . Dizziness   . CVA (cerebral infarction) 10/21/2014  . PVD (peripheral vascular disease) ([place])   . CAD (coronary artery disease)   . Factor V Leiden ([place])   . Hyperlipidemia   . Hypertension     Past Surgical History:  Procedure Laterality Date   . BLADDER REPAIR    . BREAST BIOPSY    . CORONARY ARTERY BYPASS GRAFT    . CORONARY ARTERY BYPASS GRAFT  2004   lima-lad,rima-rca,lradial-om1&2  . TOTAL ABDOMINAL HYSTERECTOMY      OB History    No data available       Home Medications    Prior to Admission medications   Medication Sig Start Date End Date Taking? Authorizing Provider  calcium-vitamin D (OSCAL WITH D) 500-200 MG-UNIT per tablet Take 1 tablet by mouth 2 (two) times daily.     Historical Provider, MD  Cholecalciferol (VITAMIN D) 2000 UNITS CAPS Take 1 capsule by mouth daily.      Historical Provider, MD  clindamycin (CLEOCIN) 300 MG capsule Take 1 capsule (300 mg total) by mouth 3 (three) times daily. 05/06/15   Ahmed Prima, MD  cyclobenzaprine (FLEXERIL) 5 MG tablet Take 5 mg by mouth 2 (two) times daily as needed for muscle spasms.    Historical Provider, MD  hydrochlorothiazide (HYDRODIURIL) 25 MG tablet Take 25 mg by mouth daily.      Historical Provider, MD  hydrocortisone (ANUSOL-HC) 25 MG suppository  01/11/14   Historical Provider, MD  Misc Natural Products (OSTEO BI-FLEX ADV DOUBLE ST) CAPS Take 2 capsules by mouth daily.      Historical Provider, MD  Multiple Vitamins-Minerals (MULTIVITAMIN WITH  MINERALS) tablet Take 1 tablet by mouth daily.      Historical Provider, MD  nitroGLYCERIN (NITROSTAT) 0.4 MG SL tablet Place 1 tablet (0.4 mg total) under the tongue every 5 (five) minutes as needed for chest pain. 01/12/14   Peter M Martinique, MD  olmesartan (BENICAR) 20 MG tablet Take 20 mg by mouth daily.      Historical Provider, MD  Omega-3 Fatty Acids (FISH OIL) 1000 MG CAPS Take 1,000 mg by mouth daily.     Historical Provider, MD  ondansetron (ZOFRAN ODT) 4 MG disintegrating tablet 4mg  ODT q4 hours prn nausea/vomit 02/21/15   Debby Freiberg, MD  pantoprazole (PROTONIX) 40 MG tablet Take 40 mg by mouth daily.      Historical Provider, MD  potassium chloride SA (K-DUR,KLOR-CON) 20 MEQ tablet Take 20 mEq by mouth  daily.      Historical Provider, MD  simvastatin (ZOCOR) 40 MG tablet Take 40 mg by mouth at bedtime.      Historical Provider, MD  traMADol (ULTRAM) 50 MG tablet Take 1 tablet (50 mg total) by mouth every 6 (six) hours as needed. 10/25/14   Verlee Monte, MD  traZODone (DESYREL) 50 MG tablet Take 50 mg by mouth at bedtime. 12/13/15   Historical Provider, MD  warfarin (COUMADIN) 3 MG tablet Take 3 mg by mouth daily. 3 mg every day    Historical Provider, MD    Family History Family History  Problem Relation Age of Onset  . Coronary artery disease Mother   . Heart disease Mother   . Heart disease Brother   . Heart attack Brother   . Heart attack Brother   . Alzheimer's disease Sister     Social History Social History  Substance Use Topics  . Smoking status: Never Smoker  . Smokeless tobacco: Never Used  . Alcohol use No     Comment: occ     Allergies   Ivp dye [iodinated diagnostic agents]   Review of Systems Review of Systems  Unable to perform ROS: Dementia     Physical Exam Updated Vital Signs BP 128/55 (BP Location: Right Arm)   Pulse (!) 58   Temp 98.3 F (36.8 C) (Oral)   Resp 18   SpO2 97%   Physical Exam  Constitutional: She is oriented to person, place, and time. She appears well-developed and well-nourished. No distress.  HENT:  Head: Normocephalic and atraumatic.  Hematoma of left ear. No hemotympanum  Eyes: Conjunctivae and EOM are normal. Pupils are equal, round, and reactive to light. No scleral icterus.  Neck: Normal range of motion.  Cardiovascular: Normal rate, regular rhythm and normal heart sounds.  Exam reveals no gallop and no friction rub.   No murmur heard. Pulmonary/Chest: Effort normal and breath sounds normal. No respiratory distress.  Abdominal: Soft. Bowel sounds are normal. She exhibits no distension and no mass. There is no tenderness. There is no guarding.  Neurological: She is alert and oriented to person, place, and time.  Skin:  Skin is warm and dry. She is not diaphoretic.  Nursing note and vitals reviewed.    ED Treatments / Results  Labs (all labs ordered are listed, but only abnormal results are displayed) Labs Reviewed  PROTIME-INR  I-STAT CHEM 8, ED    EKG  EKG Interpretation None       Radiology No results found.  Procedures Procedures (including critical care time)  Medications Ordered in ED Medications - No data to display   Initial  Impression / Assessment and Plan / ED Course  I have reviewed the triage vital signs and the nursing notes.  Pertinent labs & imaging results that were available during my care of the patient were reviewed by me and considered in my medical decision making (see chart for details).     Patient INR is therapeutic. CT scan negative for any acute abnormality of the head or C-spine. Patient was noted to have a large thyroid nodule. I discussed this with the patient and her son. He states that it started being followed outpatient at this time and they've recently had a scan of the thyroid, just about a week ago. They're willing to follow up with PCP for results. The patient has a hematoma of the ear, however. Hematoma is only ecchymotic with minimal swelling. Had the year. Reviewed by Dr. Eulis Foster who agrees that this does not require drainage at this time. The patient appears safe for discharge. Advise follow-up with PCP. Discussed return precautions.  Final Clinical Impressions(s) / ED Diagnoses   Final diagnoses:  Fall, initial encounter  Hematoma  Thyroid nodule    New Prescriptions New Prescriptions   No medications on file     Margarita Mail, PA-C 01/24/17 1640    Daleen Bo, MD 01/24/17 [phone]

## 2017-01-24 NOTE — ED Provider Notes (Signed)
  Face-to-face evaluation   History: Patient fell yesterday. Today she was more groggy than usual, so family member brought her here. She is on Coumadin.  Physical exam: Alert female. She complains of pain only in her left ear. She is alert and responsive. Slight swelling of the left pinna. No deformity associated. Heart regular rate and rhythm without murmur. Lungs clear anteriorly.  Medical screening examination/treatment/procedure(s) were conducted as a shared visit with non-physician practitioner(s) and myself.  I personally evaluated the patient during the encounter    Daleen Bo, MD 01/24/17 [phone]

## 2017-02-08 DIAGNOSIS — M62830 Muscle spasm of back: Secondary | ICD-10-CM | POA: Diagnosis not present

## 2017-02-08 DIAGNOSIS — M5134 Other intervertebral disc degeneration, thoracic region: Secondary | ICD-10-CM | POA: Diagnosis not present

## 2017-02-12 DIAGNOSIS — Z7901 Long term (current) use of anticoagulants: Secondary | ICD-10-CM | POA: Diagnosis not present

## 2017-02-12 DIAGNOSIS — D6851 Activated protein C resistance: Secondary | ICD-10-CM | POA: Diagnosis not present

## 2017-03-13 DIAGNOSIS — M62830 Muscle spasm of back: Secondary | ICD-10-CM | POA: Diagnosis not present

## 2017-03-13 DIAGNOSIS — M5134 Other intervertebral disc degeneration, thoracic region: Secondary | ICD-10-CM | POA: Diagnosis not present

## 2017-03-29 DIAGNOSIS — M810 Age-related osteoporosis without current pathological fracture: Secondary | ICD-10-CM | POA: Diagnosis not present

## 2017-03-29 DIAGNOSIS — I10 Essential (primary) hypertension: Secondary | ICD-10-CM | POA: Diagnosis not present

## 2017-03-29 DIAGNOSIS — E784 Other hyperlipidemia: Secondary | ICD-10-CM | POA: Diagnosis not present

## 2017-03-29 DIAGNOSIS — R7309 Other abnormal glucose: Secondary | ICD-10-CM | POA: Diagnosis not present

## 2017-03-29 DIAGNOSIS — E041 Nontoxic single thyroid nodule: Secondary | ICD-10-CM | POA: Diagnosis not present

## 2017-04-05 DIAGNOSIS — I6529 Occlusion and stenosis of unspecified carotid artery: Secondary | ICD-10-CM | POA: Diagnosis not present

## 2017-04-05 DIAGNOSIS — G3184 Mild cognitive impairment, so stated: Secondary | ICD-10-CM | POA: Diagnosis not present

## 2017-04-05 DIAGNOSIS — R7309 Other abnormal glucose: Secondary | ICD-10-CM | POA: Diagnosis not present

## 2017-04-05 DIAGNOSIS — Z1389 Encounter for screening for other disorder: Secondary | ICD-10-CM | POA: Diagnosis not present

## 2017-04-05 DIAGNOSIS — E041 Nontoxic single thyroid nodule: Secondary | ICD-10-CM | POA: Diagnosis not present

## 2017-04-05 DIAGNOSIS — I130 Hypertensive heart and chronic kidney disease with heart failure and stage 1 through stage 4 chronic kidney disease, or unspecified chronic kidney disease: Secondary | ICD-10-CM | POA: Diagnosis not present

## 2017-04-05 DIAGNOSIS — D6851 Activated protein C resistance: Secondary | ICD-10-CM | POA: Diagnosis not present

## 2017-04-05 DIAGNOSIS — Z7901 Long term (current) use of anticoagulants: Secondary | ICD-10-CM | POA: Diagnosis not present

## 2017-04-05 DIAGNOSIS — R627 Adult failure to thrive: Secondary | ICD-10-CM | POA: Diagnosis not present

## 2017-04-05 DIAGNOSIS — Z0000 Encounter for general adult medical examination without abnormal findings: Secondary | ICD-10-CM | POA: Diagnosis not present

## 2017-04-05 DIAGNOSIS — N183 Chronic kidney disease, stage 3 (moderate): Secondary | ICD-10-CM | POA: Diagnosis not present

## 2017-04-05 DIAGNOSIS — F3289 Other specified depressive episodes: Secondary | ICD-10-CM | POA: Diagnosis not present

## 2017-04-30 DIAGNOSIS — D6851 Activated protein C resistance: Secondary | ICD-10-CM | POA: Diagnosis not present

## 2017-04-30 DIAGNOSIS — Z7901 Long term (current) use of anticoagulants: Secondary | ICD-10-CM | POA: Diagnosis not present

## 2017-05-20 DIAGNOSIS — Z7901 Long term (current) use of anticoagulants: Secondary | ICD-10-CM | POA: Diagnosis not present

## 2017-05-20 DIAGNOSIS — D6851 Activated protein C resistance: Secondary | ICD-10-CM | POA: Diagnosis not present

## 2017-06-18 DIAGNOSIS — Z7901 Long term (current) use of anticoagulants: Secondary | ICD-10-CM | POA: Diagnosis not present

## 2017-06-18 DIAGNOSIS — D6851 Activated protein C resistance: Secondary | ICD-10-CM | POA: Diagnosis not present

## 2017-07-02 DIAGNOSIS — I10 Essential (primary) hypertension: Secondary | ICD-10-CM | POA: Diagnosis not present

## 2017-07-02 DIAGNOSIS — D6851 Activated protein C resistance: Secondary | ICD-10-CM | POA: Diagnosis not present

## 2017-07-02 DIAGNOSIS — R42 Dizziness and giddiness: Secondary | ICD-10-CM | POA: Diagnosis not present

## 2017-07-02 DIAGNOSIS — Z7901 Long term (current) use of anticoagulants: Secondary | ICD-10-CM | POA: Diagnosis not present

## 2017-07-02 DIAGNOSIS — N183 Chronic kidney disease, stage 3 (moderate): Secondary | ICD-10-CM | POA: Diagnosis not present

## 2017-07-02 DIAGNOSIS — R269 Unspecified abnormalities of gait and mobility: Secondary | ICD-10-CM | POA: Diagnosis not present

## 2017-07-16 DIAGNOSIS — D6851 Activated protein C resistance: Secondary | ICD-10-CM | POA: Diagnosis not present

## 2017-07-16 DIAGNOSIS — Z7901 Long term (current) use of anticoagulants: Secondary | ICD-10-CM | POA: Diagnosis not present

## 2017-07-16 DIAGNOSIS — I69998 Other sequelae following unspecified cerebrovascular disease: Secondary | ICD-10-CM | POA: Diagnosis not present

## 2017-07-16 DIAGNOSIS — G3184 Mild cognitive impairment, so stated: Secondary | ICD-10-CM | POA: Diagnosis not present

## 2017-07-16 DIAGNOSIS — I10 Essential (primary) hypertension: Secondary | ICD-10-CM | POA: Diagnosis not present

## 2017-07-16 DIAGNOSIS — F3289 Other specified depressive episodes: Secondary | ICD-10-CM | POA: Diagnosis not present

## 2017-07-16 DIAGNOSIS — Z6823 Body mass index (BMI) 23.0-23.9, adult: Secondary | ICD-10-CM | POA: Diagnosis not present

## 2017-07-16 DIAGNOSIS — R600 Localized edema: Secondary | ICD-10-CM | POA: Diagnosis not present

## 2017-07-16 DIAGNOSIS — I130 Hypertensive heart and chronic kidney disease with heart failure and stage 1 through stage 4 chronic kidney disease, or unspecified chronic kidney disease: Secondary | ICD-10-CM | POA: Diagnosis not present

## 2017-07-16 DIAGNOSIS — R2689 Other abnormalities of gait and mobility: Secondary | ICD-10-CM | POA: Diagnosis not present

## 2017-08-01 DIAGNOSIS — G8929 Other chronic pain: Secondary | ICD-10-CM | POA: Diagnosis not present

## 2017-08-01 DIAGNOSIS — M545 Low back pain: Secondary | ICD-10-CM | POA: Diagnosis not present

## 2017-08-13 DIAGNOSIS — D6851 Activated protein C resistance: Secondary | ICD-10-CM | POA: Diagnosis not present

## 2017-08-13 DIAGNOSIS — Z7901 Long term (current) use of anticoagulants: Secondary | ICD-10-CM | POA: Diagnosis not present

## 2017-08-13 DIAGNOSIS — Z23 Encounter for immunization: Secondary | ICD-10-CM | POA: Diagnosis not present

## 2017-09-02 DIAGNOSIS — D6851 Activated protein C resistance: Secondary | ICD-10-CM | POA: Diagnosis not present

## 2017-09-02 DIAGNOSIS — Z7901 Long term (current) use of anticoagulants: Secondary | ICD-10-CM | POA: Diagnosis not present

## 2017-10-01 DIAGNOSIS — D6851 Activated protein C resistance: Secondary | ICD-10-CM | POA: Diagnosis not present

## 2017-10-01 DIAGNOSIS — Z7901 Long term (current) use of anticoagulants: Secondary | ICD-10-CM | POA: Diagnosis not present

## 2017-10-09 DIAGNOSIS — I130 Hypertensive heart and chronic kidney disease with heart failure and stage 1 through stage 4 chronic kidney disease, or unspecified chronic kidney disease: Secondary | ICD-10-CM | POA: Diagnosis not present

## 2017-10-09 DIAGNOSIS — N390 Urinary tract infection, site not specified: Secondary | ICD-10-CM | POA: Diagnosis not present

## 2017-10-29 DIAGNOSIS — I8000 Phlebitis and thrombophlebitis of superficial vessels of unspecified lower extremity: Secondary | ICD-10-CM | POA: Diagnosis not present

## 2017-10-29 DIAGNOSIS — J069 Acute upper respiratory infection, unspecified: Secondary | ICD-10-CM | POA: Diagnosis not present

## 2017-10-29 DIAGNOSIS — D6851 Activated protein C resistance: Secondary | ICD-10-CM | POA: Diagnosis not present

## 2017-10-29 DIAGNOSIS — Z6823 Body mass index (BMI) 23.0-23.9, adult: Secondary | ICD-10-CM | POA: Diagnosis not present

## 2017-10-29 DIAGNOSIS — I6990 Unspecified sequelae of unspecified cerebrovascular disease: Secondary | ICD-10-CM | POA: Diagnosis not present

## 2017-10-29 DIAGNOSIS — R600 Localized edema: Secondary | ICD-10-CM | POA: Diagnosis not present

## 2017-10-29 DIAGNOSIS — Z7901 Long term (current) use of anticoagulants: Secondary | ICD-10-CM | POA: Diagnosis not present

## 2017-11-07 ENCOUNTER — Other Ambulatory Visit (HOSPITAL_COMMUNITY): Payer: Self-pay | Admitting: Internal Medicine

## 2017-11-07 ENCOUNTER — Ambulatory Visit (HOSPITAL_COMMUNITY)
Admission: RE | Admit: 2017-11-07 | Discharge: 2017-11-07 | Disposition: A | Discharge status: Home / Self Care | Payer: Medicare Other | Source: Ambulatory Visit | Attending: Vascular Surgery | Admitting: Vascular Surgery

## 2017-11-07 DIAGNOSIS — Z7901 Long term (current) use of anticoagulants: Secondary | ICD-10-CM | POA: Diagnosis not present

## 2017-11-07 DIAGNOSIS — R600 Localized edema: Secondary | ICD-10-CM | POA: Diagnosis not present

## 2017-11-07 DIAGNOSIS — I8001 Phlebitis and thrombophlebitis of superficial vessels of right lower extremity: Secondary | ICD-10-CM

## 2017-11-07 DIAGNOSIS — I8000 Phlebitis and thrombophlebitis of superficial vessels of unspecified lower extremity: Secondary | ICD-10-CM | POA: Diagnosis not present

## 2017-11-07 DIAGNOSIS — D6851 Activated protein C resistance: Secondary | ICD-10-CM | POA: Diagnosis not present

## 2017-11-28 DIAGNOSIS — F329 Major depressive disorder, single episode, unspecified: Secondary | ICD-10-CM | POA: Diagnosis not present

## 2017-11-28 DIAGNOSIS — G3184 Mild cognitive impairment, so stated: Secondary | ICD-10-CM | POA: Diagnosis not present

## 2017-11-28 DIAGNOSIS — I130 Hypertensive heart and chronic kidney disease with heart failure and stage 1 through stage 4 chronic kidney disease, or unspecified chronic kidney disease: Secondary | ICD-10-CM | POA: Diagnosis not present

## 2017-11-28 DIAGNOSIS — N183 Chronic kidney disease, stage 3 (moderate): Secondary | ICD-10-CM | POA: Diagnosis not present

## 2017-11-28 DIAGNOSIS — R600 Localized edema: Secondary | ICD-10-CM | POA: Diagnosis not present

## 2017-11-28 DIAGNOSIS — R627 Adult failure to thrive: Secondary | ICD-10-CM | POA: Diagnosis not present

## 2017-11-28 DIAGNOSIS — D6851 Activated protein C resistance: Secondary | ICD-10-CM | POA: Diagnosis not present

## 2017-11-28 DIAGNOSIS — Z6823 Body mass index (BMI) 23.0-23.9, adult: Secondary | ICD-10-CM | POA: Diagnosis not present

## 2017-11-28 DIAGNOSIS — I8000 Phlebitis and thrombophlebitis of superficial vessels of unspecified lower extremity: Secondary | ICD-10-CM | POA: Diagnosis not present

## 2017-11-28 DIAGNOSIS — Z7901 Long term (current) use of anticoagulants: Secondary | ICD-10-CM | POA: Diagnosis not present

## 2018-01-01 DIAGNOSIS — Z7901 Long term (current) use of anticoagulants: Secondary | ICD-10-CM | POA: Diagnosis not present

## 2018-01-01 DIAGNOSIS — D6851 Activated protein C resistance: Secondary | ICD-10-CM | POA: Diagnosis not present

## 2018-01-29 DIAGNOSIS — Z7901 Long term (current) use of anticoagulants: Secondary | ICD-10-CM | POA: Diagnosis not present

## 2018-01-29 DIAGNOSIS — D6851 Activated protein C resistance: Secondary | ICD-10-CM | POA: Diagnosis not present

## 2018-02-26 DIAGNOSIS — Z7901 Long term (current) use of anticoagulants: Secondary | ICD-10-CM | POA: Diagnosis not present

## 2018-02-26 DIAGNOSIS — D6851 Activated protein C resistance: Secondary | ICD-10-CM | POA: Diagnosis not present

## 2018-04-28 DIAGNOSIS — Z7901 Long term (current) use of anticoagulants: Secondary | ICD-10-CM | POA: Diagnosis not present

## 2018-05-12 DIAGNOSIS — Z7901 Long term (current) use of anticoagulants: Secondary | ICD-10-CM | POA: Diagnosis not present

## 2018-05-14 DIAGNOSIS — Z7901 Long term (current) use of anticoagulants: Secondary | ICD-10-CM | POA: Diagnosis not present

## 2019-06-27 DIAGNOSIS — I4821 Permanent atrial fibrillation: Secondary | ICD-10-CM | POA: Diagnosis not present

## 2019-06-27 DIAGNOSIS — Z7901 Long term (current) use of anticoagulants: Secondary | ICD-10-CM | POA: Diagnosis not present

## 2019-08-01 DIAGNOSIS — I4821 Permanent atrial fibrillation: Secondary | ICD-10-CM | POA: Diagnosis not present

## 2019-08-01 DIAGNOSIS — Z7901 Long term (current) use of anticoagulants: Secondary | ICD-10-CM | POA: Diagnosis not present

## 2020-02-05 ENCOUNTER — Ambulatory Visit: Payer: Medicare Other

## 2020-02-06 ENCOUNTER — Ambulatory Visit: Payer: Medicare Other

## 2020-08-29 ENCOUNTER — Encounter (HOSPITAL_COMMUNITY): Payer: Self-pay

## 2020-08-29 ENCOUNTER — Emergency Department (HOSPITAL_COMMUNITY)
Admission: EM | Admit: 2020-08-29 | Discharge: 2020-08-29 | Disposition: A | Discharge status: Home / Self Care | Payer: Medicare Other | Attending: Emergency Medicine | Admitting: Emergency Medicine

## 2020-08-29 ENCOUNTER — Emergency Department (HOSPITAL_COMMUNITY): Payer: Medicare Other

## 2020-08-29 DIAGNOSIS — Z955 Presence of coronary angioplasty implant and graft: Secondary | ICD-10-CM | POA: Insufficient documentation

## 2020-08-29 DIAGNOSIS — I10 Essential (primary) hypertension: Secondary | ICD-10-CM | POA: Insufficient documentation

## 2020-08-29 DIAGNOSIS — R197 Diarrhea, unspecified: Secondary | ICD-10-CM | POA: Diagnosis present

## 2020-08-29 DIAGNOSIS — N179 Acute kidney failure, unspecified: Secondary | ICD-10-CM | POA: Diagnosis not present

## 2020-08-29 DIAGNOSIS — R531 Weakness: Secondary | ICD-10-CM | POA: Diagnosis not present

## 2020-08-29 DIAGNOSIS — E860 Dehydration: Secondary | ICD-10-CM

## 2020-08-29 DIAGNOSIS — R5383 Other fatigue: Secondary | ICD-10-CM | POA: Diagnosis not present

## 2020-08-29 DIAGNOSIS — I2510 Atherosclerotic heart disease of native coronary artery without angina pectoris: Secondary | ICD-10-CM | POA: Insufficient documentation

## 2020-08-29 LAB — URINALYSIS, ROUTINE W REFLEX MICROSCOPIC
Bilirubin Urine: NEGATIVE
Glucose, UA: NEGATIVE mg/dL
Hgb urine dipstick: NEGATIVE
Ketones, ur: 20 mg/dL — AB
Leukocytes,Ua: NEGATIVE
Nitrite: NEGATIVE
Protein, ur: NEGATIVE mg/dL
Specific Gravity, Urine: 1.016 (ref 1.005–1.030)
pH: 5 (ref 5.0–8.0)

## 2020-08-29 LAB — COMPREHENSIVE METABOLIC PANEL
ALT: 17 U/L (ref 0–44)
AST: 33 U/L (ref 15–41)
Albumin: 3.4 g/dL — ABNORMAL LOW (ref 3.5–5.0)
Alkaline Phosphatase: 35 U/L — ABNORMAL LOW (ref 38–126)
Anion gap: 13 (ref 5–15)
BUN: 32 mg/dL — ABNORMAL HIGH (ref 8–23)
CO2: 25 mmol/L (ref 22–32)
Calcium: 9.4 mg/dL (ref 8.9–10.3)
Chloride: 103 mmol/L (ref 98–111)
Creatinine, Ser: 1.69 mg/dL — ABNORMAL HIGH (ref 0.44–1.00)
GFR calc Af Amer: 30 mL/min — ABNORMAL LOW (ref >60)
GFR calc non Af Amer: 26 mL/min — ABNORMAL LOW (ref >60)
Glucose, Bld: 156 mg/dL — ABNORMAL HIGH (ref 70–99)
Potassium: 3.9 mmol/L (ref 3.5–5.1)
Sodium: 141 mmol/L (ref 135–145)
Total Bilirubin: 0.6 mg/dL (ref 0.3–1.2)
Total Protein: 6.6 g/dL (ref 6.5–8.1)

## 2020-08-29 LAB — CBC
HCT: 40.2 % (ref 36.0–46.0)
Hemoglobin: 13.1 g/dL (ref 12.0–15.0)
MCH: 30.6 pg (ref 26.0–34.0)
MCHC: 32.6 g/dL (ref 30.0–36.0)
MCV: 93.9 fL (ref 80.0–100.0)
Platelets: 173 10*3/uL (ref 150–400)
RBC: 4.28 MIL/uL (ref 3.87–5.11)
RDW: 12.8 % (ref 11.5–15.5)
WBC: 8.6 10*3/uL (ref 4.0–10.5)
nRBC: 0 % (ref 0.0–0.2)

## 2020-08-29 LAB — LIPASE, BLOOD: Lipase: 26 U/L (ref 11–51)

## 2020-08-29 MED ORDER — SODIUM CHLORIDE 0.9 % IV BOLUS
1000.0000 mL | Freq: Once | INTRAVENOUS | Status: AC
  Administered 2020-08-29: 1000 mL via INTRAVENOUS

## 2020-08-29 NOTE — ED Triage Notes (Signed)
Patient reports to the ER for x2 days of diarrhea. Patient has had a decrease in appetite and wanted patient to be evaluated in ER. Patient is on warfarin, no bleeding in stool. Patient has a hx of dementia.

## 2020-08-29 NOTE — Discharge Instructions (Addendum)
You were evaluated in the Emergency Department and after careful evaluation, we did not find any emergent condition requiring admission or further testing in the hospital.  Your exam/testing today was overall reassuring.  Symptoms seem to be due to dehydration, which has caused some mild kidney impairment.  This should all improve with continued hydration at home.  Please return to the Emergency Department if you experience any worsening of your condition.  Thank you for allowing [place] to be a part of your care.

## 2020-08-29 NOTE — ED Provider Notes (Signed)
[facility] Emergency Department Provider Note MRN:  [id number]  Arrival date & time: 08/29/20     Chief Complaint   Diarrhea   History of Present Illness   Heather Alvarado is a 86 y.o. year-old female with a history of CAD, factor V leiden presenting to the ED with chief complaint of diarrhea.  Watery nonbloody diarrhea for the past 2 days.  Increased fatigue and weakness.  No fever.  No significant change to mental status per son.  I was unable to obtain an accurate HPI, PMH, or ROS due to the patient's dementia.  Level 5 caveat.  Review of Systems  Positive for diarrhea, fatigue, weakness  Patient's Health History    Past Medical History:  Diagnosis Date  . Blood transfusion without reported diagnosis   . CAD (coronary artery disease)   . Clotting disorder ([place])   . DDD (degenerative disc disease), lumbar   . Factor V Leiden ([place])   . GERD (gastroesophageal reflux disease)   . Hyperlipidemia   . Hypertension   . Internal hemorrhoids   . Multinodular goiter   . Nephrolithiasis   . Osteopenia   . Phlebitis   . PVD (peripheral vascular disease) ([facility])   . Stroke ([place]) 10/2014  . Varicose vein   . Vitamin D deficiency     Past Surgical History:  Procedure Laterality Date  . BLADDER REPAIR    . BREAST BIOPSY    . CORONARY ARTERY BYPASS GRAFT    . CORONARY ARTERY BYPASS GRAFT  2004   lima-lad,rima-rca,lradial-om1&2  . TOTAL ABDOMINAL HYSTERECTOMY      Family History  Problem Relation Age of Onset  . Coronary artery disease Mother   . Heart disease Mother   . Heart disease Brother   . Heart attack Brother   . Heart attack Brother   . Alzheimer's disease Sister     Social History   Socioeconomic History  . Marital status: Widowed    Spouse name: Not on file  . Number of children: 2  . Years of education: Not on file  . Highest education level: Not on file  Occupational History  . Occupation: Retired    Fish farm manager: RETIRED  Tobacco  Use  . Smoking status: Never Smoker  . Smokeless tobacco: Never Used  Substance and Sexual Activity  . Alcohol use: No    Alcohol/week: 0.0 standard drinks    Comment: occ  . Drug use: No  . Sexual activity: Not on file  Other Topics Concern  . Not on file  Social History Narrative   Lives at home with husband who has caregivers.     Social Determinants of Health   Financial Resource Strain:   . Difficulty of Paying Living Expenses: Not on file  Food Insecurity:   . Worried About Charity fundraiser in the Last Year: Not on file  . Ran Out of Food in the Last Year: Not on file  Transportation Needs:   . Lack of Transportation (Medical): Not on file  . Lack of Transportation (Non-Medical): Not on file  Physical Activity:   . Days of Exercise per Week: Not on file  . Minutes of Exercise per Session: Not on file  Stress:   . Feeling of Stress : Not on file  Social Connections:   . Frequency of Communication with Friends and Family: Not on file  . Frequency of Social Gatherings with Friends and Family: Not on file  . Attends Religious Services:  Not on file  . Active Member of Clubs or Organizations: Not on file  . Attends Archivist Meetings: Not on file  . Marital Status: Not on file  Intimate Partner Violence:   . Fear of Current or Ex-Partner: Not on file  . Emotionally Abused: Not on file  . Physically Abused: Not on file  . Sexually Abused: Not on file     Physical Exam   Vitals:   08/29/20 1108  BP: (!) 148/70  Pulse: 82  Resp: 15  Temp: 98.7 F (37.1 C)  SpO2: 93%    CONSTITUTIONAL: Chronically ill-appearing, NAD NEURO:  Alert and oriented to name, no focal neurological deficits EYES:  eyes equal and reactive ENT/NECK:  no LAD, no JVD CARDIO: Regular rate, well-perfused, normal S1 and S2 PULM:  CTAB no wheezing or rhonchi GI/GU:  normal bowel sounds, non-distended, non-tender MSK/SPINE:  No gross deformities, no edema SKIN:  no rash,  atraumatic PSYCH:  Appropriate speech and behavior  *Additional and/or pertinent findings included in MDM below  Diagnostic and Interventional Summary    EKG Interpretation  Date/Time:  Monday August 29 2020 11:31:13 EDT Ventricular Rate:  87 PR Interval:    QRS Duration: 122 QT Interval:  404 QTC Calculation: 458 R Axis:   -34 Text Interpretation: Sinus rhythm Atrial premature complexes Right bundle branch block Minimal ST elevation, lateral leads Confirmed by Gerlene Fee [phone]) on 08/29/2020 1:03:27 PM      Labs Reviewed  COMPREHENSIVE METABOLIC PANEL - Abnormal; Notable for the following components:      Result Value   Glucose, Bld 156 (*)    BUN 32 (*)    Creatinine, Ser 1.69 (*)    Albumin 3.4 (*)    Alkaline Phosphatase 35 (*)    GFR calc non Af Amer 26 (*)    GFR calc Af Amer 30 (*)    All other components within normal limits  URINALYSIS, ROUTINE W REFLEX MICROSCOPIC - Abnormal; Notable for the following components:   Ketones, ur 20 (*)    All other components within normal limits  LIPASE, BLOOD  CBC    DG Chest Port 1 View  Final Result      Medications  sodium chloride 0.9 % bolus 1,000 mL (0 mLs Intravenous Stopped 08/29/20 1413)     Procedures  /  Critical Care Procedures  ED Course and Medical Decision Making  I have reviewed the triage vital signs, the nursing notes, and pertinent available records from the EMR.  Listed above are laboratory and imaging tests that I personally ordered, reviewed, and interpreted and then considered in my medical decision making (see below for details).  Suspect dehydration, also considering UTI, metabolic disarray.  No focal neurological deficits, no significant change to mental status to warrant CT imaging today.  Providing IV fluids, awaiting labs.     Labs reveal mild AKI, otherwise reassuring.  Options discussed with patient and patient's son.  If they feel confident that she can continue to hydrate herself  at home, discharge would be appropriate.  If they doubt this, admission or observation would be reasonable to ensure continued hydration.  Son explains that she woke up this morning feeling much better and wanted to eat and drink, and so son is in agreement with discharge and pushing fluids at home and close observation.  Strict return precautions.  Barth Kirks. Sedonia Small, MD [place] [email or web address]  Final Clinical Impressions(s) / ED Diagnoses  ICD-10-CM   1. Dehydration  E86.0   2. AKI (acute kidney injury) ([place])  N17.9     ED Discharge Orders    None       Discharge Instructions Discussed with and Provided to Patient:     Discharge Instructions     You were evaluated in the Emergency Department and after careful evaluation, we did not find any emergent condition requiring admission or further testing in the hospital.  Your exam/testing today was overall reassuring.  Symptoms seem to be due to dehydration, which has caused some mild kidney impairment.  This should all improve with continued hydration at home.  Please return to the Emergency Department if you experience any worsening of your condition.  Thank you for allowing [place] to be a part of your care.        Maudie Flakes, MD 08/29/20 [phone]

## 2020-08-30 ENCOUNTER — Telehealth: Payer: Self-pay | Admitting: Adult Health Nurse Practitioner

## 2020-08-30 NOTE — Telephone Encounter (Signed)
Spoke with patient's son, Konrad Dolores, regarding the Palliative referral and he was in agreement with scheduling visit.  I have scheduled an In-person Consult for 09/12/20 @ 3 PM

## 2020-09-12 ENCOUNTER — Other Ambulatory Visit: Payer: Self-pay | Admitting: Adult Health Nurse Practitioner

## 2020-10-10 DEATH — deceased
